# Patient Record
Sex: Male | Born: 1965
Health system: Southern US, Community
[De-identification: ages and names within clinical notes are randomized; demographics above are authoritative.]

## PROBLEM LIST (undated history)

## (undated) DIAGNOSIS — F988 Other specified behavioral and emotional disorders with onset usually occurring in childhood and adolescence: Secondary | ICD-10-CM

## (undated) DIAGNOSIS — E785 Hyperlipidemia, unspecified: Secondary | ICD-10-CM

## (undated) DIAGNOSIS — F319 Bipolar disorder, unspecified: Secondary | ICD-10-CM

## (undated) HISTORY — DX: Other specified behavioral and emotional disorders with onset usually occurring in childhood and adolescence: F98.8

## (undated) HISTORY — DX: Bipolar disorder, unspecified: F31.9

## (undated) HISTORY — PX: FEMUR FRACTURE SURGERY: SHX633

## (undated) HISTORY — PX: OTHER SURGICAL HISTORY: SHX169

## (undated) HISTORY — DX: Hyperlipidemia, unspecified: E78.5

## (undated) HISTORY — PX: PATELLA FRACTURE SURGERY: SHX735

---

## 2000-12-31 ENCOUNTER — Emergency Department (HOSPITAL_COMMUNITY): Admission: EM | Admit: 2000-12-31 | Discharge: 2000-12-31 | Payer: Self-pay | Admitting: Emergency Medicine

## 2000-12-31 ENCOUNTER — Encounter: Payer: Self-pay | Admitting: Emergency Medicine

## 2003-01-17 ENCOUNTER — Encounter: Payer: Self-pay | Admitting: Emergency Medicine

## 2003-01-17 ENCOUNTER — Emergency Department (HOSPITAL_COMMUNITY): Admission: EM | Admit: 2003-01-17 | Discharge: 2003-01-17 | Payer: Self-pay | Admitting: Emergency Medicine

## 2003-03-02 ENCOUNTER — Emergency Department (HOSPITAL_COMMUNITY): Admission: EM | Admit: 2003-03-02 | Discharge: 2003-03-03 | Payer: Self-pay | Admitting: Emergency Medicine

## 2005-12-01 ENCOUNTER — Encounter: Admission: RE | Admit: 2005-12-01 | Discharge: 2005-12-01 | Payer: Self-pay | Admitting: Family Medicine

## 2008-06-13 ENCOUNTER — Emergency Department (HOSPITAL_BASED_OUTPATIENT_CLINIC_OR_DEPARTMENT_OTHER): Admission: EM | Admit: 2008-06-13 | Discharge: 2008-06-13 | Payer: Self-pay | Admitting: Emergency Medicine

## 2008-09-30 ENCOUNTER — Emergency Department (HOSPITAL_BASED_OUTPATIENT_CLINIC_OR_DEPARTMENT_OTHER): Admission: EM | Admit: 2008-09-30 | Discharge: 2008-09-30 | Payer: Self-pay | Admitting: Emergency Medicine

## 2011-01-29 ENCOUNTER — Emergency Department (HOSPITAL_BASED_OUTPATIENT_CLINIC_OR_DEPARTMENT_OTHER)
Admission: EM | Admit: 2011-01-29 | Discharge: 2011-01-29 | Disposition: A | Discharge status: Home / Self Care | Payer: 59 | Attending: Emergency Medicine | Admitting: Emergency Medicine

## 2011-01-29 DIAGNOSIS — R0789 Other chest pain: Secondary | ICD-10-CM | POA: Insufficient documentation

## 2011-01-29 DIAGNOSIS — R0989 Other specified symptoms and signs involving the circulatory and respiratory systems: Secondary | ICD-10-CM | POA: Insufficient documentation

## 2011-01-29 DIAGNOSIS — E785 Hyperlipidemia, unspecified: Secondary | ICD-10-CM | POA: Insufficient documentation

## 2011-01-29 DIAGNOSIS — R0609 Other forms of dyspnea: Secondary | ICD-10-CM | POA: Insufficient documentation

## 2011-01-29 DIAGNOSIS — F988 Other specified behavioral and emotional disorders with onset usually occurring in childhood and adolescence: Secondary | ICD-10-CM | POA: Insufficient documentation

## 2011-01-29 DIAGNOSIS — R7309 Other abnormal glucose: Secondary | ICD-10-CM | POA: Insufficient documentation

## 2011-01-29 LAB — DIFFERENTIAL
Basophils Absolute: 0 10*3/uL (ref 0.0–0.1)
Basophils Relative: 1 % (ref 0–1)
Eosinophils Absolute: 0.1 10*3/uL (ref 0.0–0.7)
Eosinophils Relative: 2 % (ref 0–5)
Lymphocytes Relative: 28 % (ref 12–46)
Lymphs Abs: 1.5 10*3/uL (ref 0.7–4.0)
Monocytes Absolute: 0.4 10*3/uL (ref 0.1–1.0)
Monocytes Relative: 8 % (ref 3–12)
Neutro Abs: 3.3 10*3/uL (ref 1.7–7.7)
Neutrophils Relative %: 61 % (ref 43–77)

## 2011-01-29 LAB — BASIC METABOLIC PANEL
Calcium: 9.8 mg/dL (ref 8.4–10.5)
GFR calc Af Amer: >60 mL/min (ref >60)
GFR calc non Af Amer: >60 mL/min (ref >60)
Glucose, Bld: 169 mg/dL — ABNORMAL HIGH (ref 70–99)
Potassium: 3.7 mEq/L (ref 3.5–5.1)
Sodium: 143 mEq/L (ref 135–145)

## 2011-01-29 LAB — POCT CARDIAC MARKERS
CKMB, poc: 2.3 ng/mL (ref 1.0–8.0)
Myoglobin, poc: 116 ng/mL (ref 12–200)
Troponin i, poc: <0.05 ng/mL (ref 0.00–0.09)

## 2011-01-29 LAB — CBC
HCT: 42 % (ref 39.0–52.0)
Hemoglobin: 15 g/dL (ref 13.0–17.0)
MCH: 30.2 pg (ref 26.0–34.0)
MCHC: 35.7 g/dL (ref 30.0–36.0)
MCV: 84.7 fL (ref 78.0–100.0)
Platelets: 166 10*3/uL (ref 150–400)
RBC: 4.96 MIL/uL (ref 4.22–5.81)
RDW: 12.5 % (ref 11.5–15.5)
WBC: 5.4 10*3/uL (ref 4.0–10.5)

## 2015-12-31 LAB — HM COLONOSCOPY

## 2017-02-23 DIAGNOSIS — Z125 Encounter for screening for malignant neoplasm of prostate: Secondary | ICD-10-CM | POA: Diagnosis not present

## 2017-02-23 DIAGNOSIS — Z0000 Encounter for general adult medical examination without abnormal findings: Secondary | ICD-10-CM | POA: Diagnosis not present

## 2017-02-23 DIAGNOSIS — E7800 Pure hypercholesterolemia, unspecified: Secondary | ICD-10-CM | POA: Diagnosis not present

## 2017-03-02 DIAGNOSIS — J8410 Pulmonary fibrosis, unspecified: Secondary | ICD-10-CM | POA: Diagnosis not present

## 2017-03-02 DIAGNOSIS — R911 Solitary pulmonary nodule: Secondary | ICD-10-CM | POA: Diagnosis not present

## 2017-03-02 DIAGNOSIS — K7689 Other specified diseases of liver: Secondary | ICD-10-CM | POA: Diagnosis not present

## 2017-03-30 DIAGNOSIS — R972 Elevated prostate specific antigen [PSA]: Secondary | ICD-10-CM | POA: Diagnosis not present

## 2017-05-11 DIAGNOSIS — R972 Elevated prostate specific antigen [PSA]: Secondary | ICD-10-CM | POA: Diagnosis not present

## 2018-03-01 ENCOUNTER — Ambulatory Visit
Admission: RE | Admit: 2018-03-01 | Discharge: 2018-03-01 | Disposition: A | Discharge status: Home / Self Care | Payer: BLUE CROSS/BLUE SHIELD | Source: Ambulatory Visit | Attending: Family Medicine | Admitting: Family Medicine

## 2018-03-01 ENCOUNTER — Other Ambulatory Visit: Payer: Self-pay | Admitting: Family Medicine

## 2018-03-01 DIAGNOSIS — M545 Low back pain: Secondary | ICD-10-CM | POA: Diagnosis not present

## 2018-03-01 DIAGNOSIS — F900 Attention-deficit hyperactivity disorder, predominantly inattentive type: Secondary | ICD-10-CM | POA: Diagnosis not present

## 2018-03-29 DIAGNOSIS — R972 Elevated prostate specific antigen [PSA]: Secondary | ICD-10-CM | POA: Diagnosis not present

## 2018-03-29 DIAGNOSIS — F900 Attention-deficit hyperactivity disorder, predominantly inattentive type: Secondary | ICD-10-CM | POA: Diagnosis not present

## 2018-03-29 DIAGNOSIS — F320 Major depressive disorder, single episode, mild: Secondary | ICD-10-CM | POA: Diagnosis not present

## 2018-03-29 DIAGNOSIS — Z0000 Encounter for general adult medical examination without abnormal findings: Secondary | ICD-10-CM | POA: Diagnosis not present

## 2018-03-29 DIAGNOSIS — M545 Low back pain: Secondary | ICD-10-CM | POA: Diagnosis not present

## 2018-03-29 DIAGNOSIS — E7800 Pure hypercholesterolemia, unspecified: Secondary | ICD-10-CM | POA: Diagnosis not present

## 2018-05-01 DIAGNOSIS — F900 Attention-deficit hyperactivity disorder, predominantly inattentive type: Secondary | ICD-10-CM | POA: Diagnosis not present

## 2018-05-01 DIAGNOSIS — M545 Low back pain: Secondary | ICD-10-CM | POA: Diagnosis not present

## 2018-05-01 DIAGNOSIS — F320 Major depressive disorder, single episode, mild: Secondary | ICD-10-CM | POA: Diagnosis not present

## 2018-06-10 DIAGNOSIS — E7800 Pure hypercholesterolemia, unspecified: Secondary | ICD-10-CM | POA: Diagnosis not present

## 2018-10-01 DIAGNOSIS — F900 Attention-deficit hyperactivity disorder, predominantly inattentive type: Secondary | ICD-10-CM | POA: Diagnosis not present

## 2018-10-01 DIAGNOSIS — Z23 Encounter for immunization: Secondary | ICD-10-CM | POA: Diagnosis not present

## 2018-10-01 DIAGNOSIS — F320 Major depressive disorder, single episode, mild: Secondary | ICD-10-CM | POA: Diagnosis not present

## 2018-10-24 DIAGNOSIS — Z79891 Long term (current) use of opiate analgesic: Secondary | ICD-10-CM | POA: Diagnosis not present

## 2018-10-24 DIAGNOSIS — F319 Bipolar disorder, unspecified: Secondary | ICD-10-CM | POA: Diagnosis not present

## 2018-10-24 DIAGNOSIS — F410 Panic disorder [episodic paroxysmal anxiety] without agoraphobia: Secondary | ICD-10-CM | POA: Diagnosis not present

## 2018-10-24 DIAGNOSIS — F4481 Dissociative identity disorder: Secondary | ICD-10-CM | POA: Diagnosis not present

## 2018-11-21 DIAGNOSIS — F4481 Dissociative identity disorder: Secondary | ICD-10-CM | POA: Diagnosis not present

## 2018-11-21 DIAGNOSIS — F319 Bipolar disorder, unspecified: Secondary | ICD-10-CM | POA: Diagnosis not present

## 2018-11-21 DIAGNOSIS — F410 Panic disorder [episodic paroxysmal anxiety] without agoraphobia: Secondary | ICD-10-CM | POA: Diagnosis not present

## 2018-12-12 DIAGNOSIS — F319 Bipolar disorder, unspecified: Secondary | ICD-10-CM | POA: Diagnosis not present

## 2018-12-12 DIAGNOSIS — F410 Panic disorder [episodic paroxysmal anxiety] without agoraphobia: Secondary | ICD-10-CM | POA: Diagnosis not present

## 2018-12-23 DIAGNOSIS — F4481 Dissociative identity disorder: Secondary | ICD-10-CM | POA: Diagnosis not present

## 2018-12-23 DIAGNOSIS — F319 Bipolar disorder, unspecified: Secondary | ICD-10-CM | POA: Diagnosis not present

## 2018-12-23 DIAGNOSIS — F410 Panic disorder [episodic paroxysmal anxiety] without agoraphobia: Secondary | ICD-10-CM | POA: Diagnosis not present

## 2019-01-07 DIAGNOSIS — F319 Bipolar disorder, unspecified: Secondary | ICD-10-CM | POA: Diagnosis not present

## 2019-01-07 DIAGNOSIS — F4481 Dissociative identity disorder: Secondary | ICD-10-CM | POA: Diagnosis not present

## 2019-01-07 DIAGNOSIS — F410 Panic disorder [episodic paroxysmal anxiety] without agoraphobia: Secondary | ICD-10-CM | POA: Diagnosis not present

## 2019-01-22 DIAGNOSIS — F319 Bipolar disorder, unspecified: Secondary | ICD-10-CM | POA: Diagnosis not present

## 2019-01-22 DIAGNOSIS — F4481 Dissociative identity disorder: Secondary | ICD-10-CM | POA: Diagnosis not present

## 2019-01-22 DIAGNOSIS — F410 Panic disorder [episodic paroxysmal anxiety] without agoraphobia: Secondary | ICD-10-CM | POA: Diagnosis not present

## 2019-02-20 DIAGNOSIS — F319 Bipolar disorder, unspecified: Secondary | ICD-10-CM | POA: Diagnosis not present

## 2019-02-20 DIAGNOSIS — F410 Panic disorder [episodic paroxysmal anxiety] without agoraphobia: Secondary | ICD-10-CM | POA: Diagnosis not present

## 2019-04-02 DIAGNOSIS — Z125 Encounter for screening for malignant neoplasm of prostate: Secondary | ICD-10-CM | POA: Diagnosis not present

## 2019-04-02 DIAGNOSIS — E7800 Pure hypercholesterolemia, unspecified: Secondary | ICD-10-CM | POA: Diagnosis not present

## 2019-04-02 DIAGNOSIS — F900 Attention-deficit hyperactivity disorder, predominantly inattentive type: Secondary | ICD-10-CM | POA: Diagnosis not present

## 2019-04-02 DIAGNOSIS — F319 Bipolar disorder, unspecified: Secondary | ICD-10-CM | POA: Diagnosis not present

## 2019-04-07 DIAGNOSIS — E7800 Pure hypercholesterolemia, unspecified: Secondary | ICD-10-CM | POA: Diagnosis not present

## 2019-04-07 DIAGNOSIS — Z125 Encounter for screening for malignant neoplasm of prostate: Secondary | ICD-10-CM | POA: Diagnosis not present

## 2019-04-14 DIAGNOSIS — F410 Panic disorder [episodic paroxysmal anxiety] without agoraphobia: Secondary | ICD-10-CM | POA: Diagnosis not present

## 2019-04-21 DIAGNOSIS — F410 Panic disorder [episodic paroxysmal anxiety] without agoraphobia: Secondary | ICD-10-CM | POA: Diagnosis not present

## 2019-04-21 DIAGNOSIS — F4481 Dissociative identity disorder: Secondary | ICD-10-CM | POA: Diagnosis not present

## 2019-04-21 DIAGNOSIS — F319 Bipolar disorder, unspecified: Secondary | ICD-10-CM | POA: Diagnosis not present

## 2019-05-20 DIAGNOSIS — F410 Panic disorder [episodic paroxysmal anxiety] without agoraphobia: Secondary | ICD-10-CM | POA: Diagnosis not present

## 2019-05-20 DIAGNOSIS — F319 Bipolar disorder, unspecified: Secondary | ICD-10-CM | POA: Diagnosis not present

## 2019-05-20 DIAGNOSIS — F4481 Dissociative identity disorder: Secondary | ICD-10-CM | POA: Diagnosis not present

## 2019-10-06 DIAGNOSIS — F410 Panic disorder [episodic paroxysmal anxiety] without agoraphobia: Secondary | ICD-10-CM | POA: Diagnosis not present

## 2019-10-06 DIAGNOSIS — F4481 Dissociative identity disorder: Secondary | ICD-10-CM | POA: Diagnosis not present

## 2019-10-06 DIAGNOSIS — F319 Bipolar disorder, unspecified: Secondary | ICD-10-CM | POA: Diagnosis not present

## 2019-10-08 DIAGNOSIS — F4481 Dissociative identity disorder: Secondary | ICD-10-CM | POA: Diagnosis not present

## 2019-10-08 DIAGNOSIS — F410 Panic disorder [episodic paroxysmal anxiety] without agoraphobia: Secondary | ICD-10-CM | POA: Diagnosis not present

## 2019-10-08 DIAGNOSIS — F319 Bipolar disorder, unspecified: Secondary | ICD-10-CM | POA: Diagnosis not present

## 2019-10-13 DIAGNOSIS — F410 Panic disorder [episodic paroxysmal anxiety] without agoraphobia: Secondary | ICD-10-CM | POA: Diagnosis not present

## 2019-10-13 DIAGNOSIS — F319 Bipolar disorder, unspecified: Secondary | ICD-10-CM | POA: Diagnosis not present

## 2019-10-13 DIAGNOSIS — F4481 Dissociative identity disorder: Secondary | ICD-10-CM | POA: Diagnosis not present

## 2019-10-29 DIAGNOSIS — F410 Panic disorder [episodic paroxysmal anxiety] without agoraphobia: Secondary | ICD-10-CM | POA: Diagnosis not present

## 2019-10-29 DIAGNOSIS — F4481 Dissociative identity disorder: Secondary | ICD-10-CM | POA: Diagnosis not present

## 2019-10-29 DIAGNOSIS — F319 Bipolar disorder, unspecified: Secondary | ICD-10-CM | POA: Diagnosis not present

## 2019-11-04 DIAGNOSIS — F4481 Dissociative identity disorder: Secondary | ICD-10-CM | POA: Diagnosis not present

## 2019-11-04 DIAGNOSIS — F410 Panic disorder [episodic paroxysmal anxiety] without agoraphobia: Secondary | ICD-10-CM | POA: Diagnosis not present

## 2019-11-04 DIAGNOSIS — F319 Bipolar disorder, unspecified: Secondary | ICD-10-CM | POA: Diagnosis not present

## 2019-11-12 DIAGNOSIS — F410 Panic disorder [episodic paroxysmal anxiety] without agoraphobia: Secondary | ICD-10-CM | POA: Diagnosis not present

## 2019-11-12 DIAGNOSIS — F319 Bipolar disorder, unspecified: Secondary | ICD-10-CM | POA: Diagnosis not present

## 2019-11-12 DIAGNOSIS — F4481 Dissociative identity disorder: Secondary | ICD-10-CM | POA: Diagnosis not present

## 2019-11-25 DIAGNOSIS — F410 Panic disorder [episodic paroxysmal anxiety] without agoraphobia: Secondary | ICD-10-CM | POA: Diagnosis not present

## 2019-11-25 DIAGNOSIS — F319 Bipolar disorder, unspecified: Secondary | ICD-10-CM | POA: Diagnosis not present

## 2019-11-25 DIAGNOSIS — F4481 Dissociative identity disorder: Secondary | ICD-10-CM | POA: Diagnosis not present

## 2019-12-10 DIAGNOSIS — F4481 Dissociative identity disorder: Secondary | ICD-10-CM | POA: Diagnosis not present

## 2019-12-10 DIAGNOSIS — F410 Panic disorder [episodic paroxysmal anxiety] without agoraphobia: Secondary | ICD-10-CM | POA: Diagnosis not present

## 2019-12-10 DIAGNOSIS — F319 Bipolar disorder, unspecified: Secondary | ICD-10-CM | POA: Diagnosis not present

## 2019-12-24 DIAGNOSIS — F410 Panic disorder [episodic paroxysmal anxiety] without agoraphobia: Secondary | ICD-10-CM | POA: Diagnosis not present

## 2019-12-24 DIAGNOSIS — F319 Bipolar disorder, unspecified: Secondary | ICD-10-CM | POA: Diagnosis not present

## 2019-12-24 DIAGNOSIS — F4481 Dissociative identity disorder: Secondary | ICD-10-CM | POA: Diagnosis not present

## 2020-01-07 DIAGNOSIS — F319 Bipolar disorder, unspecified: Secondary | ICD-10-CM | POA: Diagnosis not present

## 2020-01-07 DIAGNOSIS — F410 Panic disorder [episodic paroxysmal anxiety] without agoraphobia: Secondary | ICD-10-CM | POA: Diagnosis not present

## 2020-01-07 DIAGNOSIS — F4481 Dissociative identity disorder: Secondary | ICD-10-CM | POA: Diagnosis not present

## 2020-02-02 DIAGNOSIS — F4481 Dissociative identity disorder: Secondary | ICD-10-CM | POA: Diagnosis not present

## 2020-02-02 DIAGNOSIS — F319 Bipolar disorder, unspecified: Secondary | ICD-10-CM | POA: Diagnosis not present

## 2020-02-02 DIAGNOSIS — F410 Panic disorder [episodic paroxysmal anxiety] without agoraphobia: Secondary | ICD-10-CM | POA: Diagnosis not present

## 2020-02-11 DIAGNOSIS — F410 Panic disorder [episodic paroxysmal anxiety] without agoraphobia: Secondary | ICD-10-CM | POA: Diagnosis not present

## 2020-02-11 DIAGNOSIS — F4481 Dissociative identity disorder: Secondary | ICD-10-CM | POA: Diagnosis not present

## 2020-02-11 DIAGNOSIS — F319 Bipolar disorder, unspecified: Secondary | ICD-10-CM | POA: Diagnosis not present

## 2020-04-05 DIAGNOSIS — Z0000 Encounter for general adult medical examination without abnormal findings: Secondary | ICD-10-CM | POA: Diagnosis not present

## 2020-04-05 DIAGNOSIS — E7800 Pure hypercholesterolemia, unspecified: Secondary | ICD-10-CM | POA: Diagnosis not present

## 2020-04-05 DIAGNOSIS — R972 Elevated prostate specific antigen [PSA]: Secondary | ICD-10-CM | POA: Diagnosis not present

## 2020-08-12 DIAGNOSIS — F410 Panic disorder [episodic paroxysmal anxiety] without agoraphobia: Secondary | ICD-10-CM | POA: Diagnosis not present

## 2020-08-12 DIAGNOSIS — F319 Bipolar disorder, unspecified: Secondary | ICD-10-CM | POA: Diagnosis not present

## 2020-08-12 DIAGNOSIS — F4481 Dissociative identity disorder: Secondary | ICD-10-CM | POA: Diagnosis not present

## 2020-09-14 DIAGNOSIS — R251 Tremor, unspecified: Secondary | ICD-10-CM | POA: Diagnosis not present

## 2020-09-14 DIAGNOSIS — Z23 Encounter for immunization: Secondary | ICD-10-CM | POA: Diagnosis not present

## 2020-10-21 ENCOUNTER — Encounter: Payer: Self-pay | Admitting: Neurology

## 2020-10-21 DIAGNOSIS — F4481 Dissociative identity disorder: Secondary | ICD-10-CM | POA: Diagnosis not present

## 2020-10-21 DIAGNOSIS — F410 Panic disorder [episodic paroxysmal anxiety] without agoraphobia: Secondary | ICD-10-CM | POA: Diagnosis not present

## 2020-10-21 DIAGNOSIS — F319 Bipolar disorder, unspecified: Secondary | ICD-10-CM | POA: Diagnosis not present

## 2020-10-21 NOTE — Progress Notes (Signed)
Assessment/Plan:   Secondary parkinsonism   -I had a long counseling session with the patient today.  I discussed with the patient that he likely has secondary parkinsonism due to abilify.  I explained that one clinically cannot tell the difference between idiopathic parkinsons disease and secondary parkinsonism from medication.  I also explained that even if one is able to get off of the medication, it can take up to 6 months to clinically definitively know if this is idiopathic parkinsons disease.  He previously went off of it, but only for about a week and a half.  I did not advise that the patient go off of medication, as this needs to be discussed with the patients prescribing physician.  I did, however, tell the patient that the longer one is on the medication, the worse the symptoms can get.  The patient is to make an appointment with his psychiatrist to discuss what I have discussed with him.    Subjective:   Terrance Webb was seen today in the movement disorders clinic for neurologic consultation at the request of Clovis Riley, L.August Saucer, MD.  The consultation is for the evaluation of resting tremor of the right hand, present for the last few months.  Primary care records are reviewed.    Specific Symptoms:  Tremor: Yes.   , R hand rest tremor, rarely in the L.  No change in the last few months Family hx of similar:  No. Voice: no change Sleep: sleeps well  Vivid Dreams:  Yes.    Acting out dreams:  No. Wet Pillows: Yes.   Postural symptoms:  No.  Falls?  No. Bradykinesia symptoms: shuffling gait - "I've been a shuffler." Loss of smell:  No. Loss of taste:  No. Urinary Incontinence:  No. Difficulty Swallowing:  Yes.  , chronic issues with dry foods Handwriting, micrographia: No. Trouble with ADL's:  No.  Trouble buttoning clothing: No. Memory changes:  No. Hallucinations:  No.  visual distortions: No. N/V:  No. Lightheaded:  No.  Syncope: No. Diplopia:   No. Dyskinesia:  No. Prior exposure to reglan/antipsychotics: Yes.  , abilify.  Did speak with psychiatrist about whether or not the medication could be related to Abilify, and he believes that psychiatry just offered him to take a medication to help him with tremor.  States that went off of med for 1.5 weeks and didn't note change and anxiety worse  Neuroimaging of the brain has previously been performed but not in the recent years    ALLERGIES:   Allergies  Allergen Reactions  . Vyvanse [Lisdexamfetamine] Other (See Comments)    Panic attacks    CURRENT MEDICATIONS:  Current Outpatient Medications  Medication Instructions  . ARIPiprazole (ABILIFY) 2 mg, Oral, Daily  . atorvastatin (LIPITOR) 40 mg, Oral, Daily  . etodolac (LODINE) 400 mg, Oral, 2 times daily  . lamoTRIgine (LAMICTAL) 200 mg, Oral, Daily  . Melatonin 2.5 MG CAPS Oral, At bedtime PRN  . sertraline (ZOLOFT) 50 mg, Oral, Daily    Objective:   VITALS:   Vitals:   10/25/20 0911  BP: 134/86  Pulse: 66  SpO2: 98%  Weight: 192 lb (87.1 kg)  Height: 5\' 8"  (1.727 m)    GEN:  The patient appears stated age and is in NAD. HEENT:  Normocephalic, atraumatic.  The mucous membranes are moist. The superficial temporal arteries are without ropiness or tenderness. CV:  RRR Lungs:  CTAB Neck/HEME:  There are no carotid bruits bilaterally.  Neurological  examination:  Orientation: The patient is alert and oriented x3.  Cranial nerves: There is good facial symmetry. Extraocular muscles are intact. The visual fields are full to confrontational testing. The speech is fluent and clear. Soft palate rises symmetrically and there is no tongue deviation. Hearing is intact to conversational tone. Sensation: Sensation is intact to light and pinprick throughout (facial, trunk, extremities). Vibration is intact at the bilateral big toe. There is no extinction with double simultaneous stimulation. There is no sensory dermatomal level  identified. Motor: Strength is 5/5 in the bilateral upper and lower extremities.   Shoulder shrug is equal and symmetric.  There is no pronator drift. Deep tendon reflexes: Deep tendon reflexes are 2/4 at the bilateral biceps, triceps, brachioradialis, patella and achilles. Plantar responses are downgoing bilaterally.  Movement examination: Tone: There is trace increased tone in the RUE.  Tone elsewhere is nl Abnormal movements: nothere is R>LUE rest tremor Coordination:  There is no decremation with RAM's, with any form of RAMS, including alternating supination and pronation of the forearm, hand opening and closing, finger taps, heel taps and toe taps bilaterally Gait and Station: The patient has no difficulty arising out of a deep-seated chair without the use of the hands. The patient's stride length is good.   I have reviewed and interpreted the following labs independently Labs done at primary care on Apr 05, 2020.  Sodium was 141, potassium 4.2, chloride 106, CO2 29, BUN 22, creatinine 1.14, glucose 104, BUN 22, creatinine 1.14, AST 25, ALT 47.    Cc:  Clovis Riley, L.August Saucer, MD

## 2020-10-25 ENCOUNTER — Other Ambulatory Visit: Payer: Self-pay

## 2020-10-25 ENCOUNTER — Encounter: Payer: Self-pay | Admitting: Neurology

## 2020-10-25 ENCOUNTER — Ambulatory Visit (INDEPENDENT_AMBULATORY_CARE_PROVIDER_SITE_OTHER): Payer: BC Managed Care – PPO | Admitting: Neurology

## 2020-10-25 VITALS — BP 134/86 | HR 66 | Ht 68.0 in | Wt 192.0 lb

## 2020-10-25 DIAGNOSIS — G2111 Neuroleptic induced parkinsonism: Secondary | ICD-10-CM

## 2020-10-25 NOTE — Patient Instructions (Addendum)
It was my pleasure to meet you!  Follow up with your psychiatrist to see if there is another medication to replace your abilify.  Do NOT change your medication without your physicians approval.  If you are able to get off of that medication, your symptoms should improve in the next 6 months.  If they don't, make a follow up with me.    The physicians and staff at [facility] are committed to providing excellent care. You may receive a survey requesting feedback about your experience at our office. We strive to receive "very good" responses to the survey questions. If you feel that your experience would prevent you from giving the office a "very good " response, please contact our office to try to remedy the situation. We may be reached at [phone]. Thank you for taking the time out of your busy day to complete the survey.

## 2021-01-27 DIAGNOSIS — F410 Panic disorder [episodic paroxysmal anxiety] without agoraphobia: Secondary | ICD-10-CM | POA: Diagnosis not present

## 2021-01-27 DIAGNOSIS — F4481 Dissociative identity disorder: Secondary | ICD-10-CM | POA: Diagnosis not present

## 2021-01-27 DIAGNOSIS — F319 Bipolar disorder, unspecified: Secondary | ICD-10-CM | POA: Diagnosis not present

## 2021-02-24 DIAGNOSIS — F4481 Dissociative identity disorder: Secondary | ICD-10-CM | POA: Diagnosis not present

## 2021-02-24 DIAGNOSIS — F319 Bipolar disorder, unspecified: Secondary | ICD-10-CM | POA: Diagnosis not present

## 2021-02-24 DIAGNOSIS — F410 Panic disorder [episodic paroxysmal anxiety] without agoraphobia: Secondary | ICD-10-CM | POA: Diagnosis not present

## 2021-03-29 DIAGNOSIS — F410 Panic disorder [episodic paroxysmal anxiety] without agoraphobia: Secondary | ICD-10-CM | POA: Diagnosis not present

## 2021-03-29 DIAGNOSIS — F319 Bipolar disorder, unspecified: Secondary | ICD-10-CM | POA: Diagnosis not present

## 2021-03-29 DIAGNOSIS — F4481 Dissociative identity disorder: Secondary | ICD-10-CM | POA: Diagnosis not present

## 2021-04-07 DIAGNOSIS — R972 Elevated prostate specific antigen [PSA]: Secondary | ICD-10-CM | POA: Diagnosis not present

## 2021-04-07 DIAGNOSIS — E7800 Pure hypercholesterolemia, unspecified: Secondary | ICD-10-CM | POA: Diagnosis not present

## 2021-04-07 DIAGNOSIS — Z23 Encounter for immunization: Secondary | ICD-10-CM | POA: Diagnosis not present

## 2021-04-07 DIAGNOSIS — Z0000 Encounter for general adult medical examination without abnormal findings: Secondary | ICD-10-CM | POA: Diagnosis not present

## 2021-06-08 DIAGNOSIS — Z23 Encounter for immunization: Secondary | ICD-10-CM | POA: Diagnosis not present

## 2021-07-04 DIAGNOSIS — F4481 Dissociative identity disorder: Secondary | ICD-10-CM | POA: Diagnosis not present

## 2021-07-04 DIAGNOSIS — F410 Panic disorder [episodic paroxysmal anxiety] without agoraphobia: Secondary | ICD-10-CM | POA: Diagnosis not present

## 2021-07-04 DIAGNOSIS — F319 Bipolar disorder, unspecified: Secondary | ICD-10-CM | POA: Diagnosis not present

## 2021-12-29 DIAGNOSIS — F319 Bipolar disorder, unspecified: Secondary | ICD-10-CM | POA: Diagnosis not present

## 2021-12-29 DIAGNOSIS — F410 Panic disorder [episodic paroxysmal anxiety] without agoraphobia: Secondary | ICD-10-CM | POA: Diagnosis not present

## 2021-12-29 DIAGNOSIS — F4481 Dissociative identity disorder: Secondary | ICD-10-CM | POA: Diagnosis not present

## 2022-01-24 ENCOUNTER — Encounter: Payer: Self-pay | Admitting: Family Medicine

## 2022-01-24 ENCOUNTER — Other Ambulatory Visit: Payer: Self-pay

## 2022-01-24 ENCOUNTER — Ambulatory Visit (INDEPENDENT_AMBULATORY_CARE_PROVIDER_SITE_OTHER): Payer: BC Managed Care – PPO | Admitting: Family Medicine

## 2022-01-24 VITALS — BP 117/70 | HR 59 | Resp 16 | Ht 68.0 in | Wt 192.0 lb

## 2022-01-24 DIAGNOSIS — E7800 Pure hypercholesterolemia, unspecified: Secondary | ICD-10-CM | POA: Diagnosis not present

## 2022-01-24 DIAGNOSIS — G8929 Other chronic pain: Secondary | ICD-10-CM

## 2022-01-24 DIAGNOSIS — K219 Gastro-esophageal reflux disease without esophagitis: Secondary | ICD-10-CM | POA: Insufficient documentation

## 2022-01-24 DIAGNOSIS — F988 Other specified behavioral and emotional disorders with onset usually occurring in childhood and adolescence: Secondary | ICD-10-CM | POA: Insufficient documentation

## 2022-01-24 DIAGNOSIS — M545 Low back pain, unspecified: Secondary | ICD-10-CM | POA: Diagnosis not present

## 2022-01-24 DIAGNOSIS — F319 Bipolar disorder, unspecified: Secondary | ICD-10-CM | POA: Diagnosis not present

## 2022-01-24 DIAGNOSIS — F1021 Alcohol dependence, in remission: Secondary | ICD-10-CM

## 2022-01-24 DIAGNOSIS — G2111 Neuroleptic induced parkinsonism: Secondary | ICD-10-CM

## 2022-01-24 DIAGNOSIS — R911 Solitary pulmonary nodule: Secondary | ICD-10-CM | POA: Insufficient documentation

## 2022-01-24 DIAGNOSIS — K6289 Other specified diseases of anus and rectum: Secondary | ICD-10-CM

## 2022-01-24 NOTE — Assessment & Plan Note (Addendum)
On Etoladac daily.

## 2022-01-24 NOTE — Progress Notes (Signed)
Established Patient Office Visit  Subjective:  Patient ID: Terrance Webb, male    DOB: 11-Nov-1966  Age: 56 y.o. MRN: [id number]  CC:  Chief Complaint  Patient presents with   Establish Care   Rectal Concern    Growth near rectum for several years. Patient denies pain or bleeding.    Colonoscopy     Done at 56 years old with [facility] in [place]. ( Release form faxed )    HPI Terrance Webb presents to establish care.  Overall he is doing well he has a history of bipolar disorder and is currently being treated with Zoloft, Abilify, and Lamictal.  He is been pretty stable on his current regimen for about the last 2 years he has been happy with it.  Unfortunately one of the side effects of the Abilify is that it has caused a a tremor.  He did consult with neurology and they felt like it was consistent with pseudo-Parkinson's secondary to the Abilify.  So they did add propranolol twice a day to help control the tremor he does feel like it is helpful but it does not stop it completely.  He does follow with a nurse practitioner for his psychiatric medications.  He also has history of chronic low back pain and takes etodolac twice a day he says if he comes off of it the back pain tends to come back.  History of hyperlipidemia-he has been on Lipitor for years and has tolerated it well without any problems or side effects.  He is concerned about a lump in the rectum he notices it sometimes when he is in the shower cleaning.  It is not painful or bothersome he does not remember if it was there when he had his colonoscopy at 49.  Is not had any bleeding.  He has had a history of hemorrhoids and has had to have those lanced before.  He was previously followed at [facility].  Past Medical History:  Diagnosis Date   ADD (attention deficit disorder)    Bipolar disorder (HCC)    Hyperlipidemia    hypercholesterolemia    Past Surgical History:  Procedure Laterality Date   FEMUR  FRACTURE SURGERY Left    left thigh lipoma     PATELLA FRACTURE SURGERY Left     Family History  Problem Relation Age of Onset   CAD Mother    Other Mother        respiratory sclerosis   Mental illness Mother        Borderline or Cinalli disorder./Opioid dependence   Alzheimer's disease Father    Heart attack Father    Prostate cancer Father    Melanoma Father     Social History   Socioeconomic History   Marital status: Married    Spouse name: Not on file   Number of children: 0   Years of education: Not on file   Highest education level: Not on file  Occupational History   Occupation: Retired  Tobacco Use   Smoking status: Never   Smokeless tobacco: Never  Vaping Use   Vaping Use: Never used  Substance and Sexual Activity   Alcohol use: Not Currently    Comment: hx of heavier EtOH use - quit 2 years ago   Drug use: Never   Sexual activity: Not Currently  Other Topics Concern   Not on file  Social History Narrative   No exercise. 3 cups of coffee a day  Social Determinants of Health   Financial Resource Strain: Not on file  Food Insecurity: Not on file  Transportation Needs: Not on file  Physical Activity: Not on file  Stress: Not on file  Social Connections: Not on file  Intimate Partner Violence: Not on file    Outpatient Medications Prior to Visit  Medication Sig Dispense Refill   ARIPiprazole (ABILIFY) 2 MG tablet Take 2 mg by mouth daily.     atorvastatin (LIPITOR) 40 MG tablet Take 40 mg by mouth daily.     etodolac (LODINE) 400 MG tablet Take 400 mg by mouth 2 (two) times daily.     lamoTRIgine (LAMICTAL) 200 MG tablet Take 200 mg by mouth daily.     Melatonin 2.5 MG CAPS Take by mouth at bedtime as needed.     sertraline (ZOLOFT) 50 MG tablet Take 50 mg by mouth daily.     No facility-administered medications prior to visit.    Allergies  Allergen Reactions   Vyvanse [Lisdexamfetamine] Other (See Comments)    Panic attacks     ROS Review of Systems    Objective:    Physical Exam Constitutional:      Appearance: Normal appearance. He is well-developed.  HENT:     Head: Normocephalic and atraumatic.  Cardiovascular:     Rate and Rhythm: Normal rate and regular rhythm.     Heart sounds: Normal heart sounds.  Pulmonary:     Effort: Pulmonary effort is normal.     Breath sounds: Normal breath sounds.  Skin:    General: Skin is warm and dry.     Comments: Around the rectum close to the 1 o'clock position there is a little bit of white scar tissue likely from a prior hemorrhoidectomy.  No bulge or mass or abnormal appearing tissue.  Neurological:     Mental Status: He is alert and oriented to person, place, and time. Mental status is at baseline.  Psychiatric:        Behavior: Behavior normal.    BP 117/70    Pulse (!) 59    Resp 16    Ht 5\' 8"  (1.727 m)    Wt 192 lb (87.1 kg)    SpO2 95%    BMI 29.19 kg/m  Wt Readings from Last 3 Encounters:  01/24/22 192 lb (87.1 kg)  10/25/20 192 lb (87.1 kg)     Health Maintenance Due  Topic Date Due   HIV Screening  Never done   Hepatitis C Screening  Never done   COLONOSCOPY (Pts 45-71yrs Insurance coverage will need to be confirmed)  Never done   Zoster Vaccines- Shingrix (1 of 2) Never done   COVID-19 Vaccine (4 - Booster for Moderna series) 03/14/2021    There are no preventive care reminders to display for this patient.  No results found for: TSH Lab Results  Component Value Date   WBC 5.4 01/29/2011   HGB 15.0 01/29/2011   HCT 42.0 01/29/2011   MCV 84.7 01/29/2011   PLT 166 01/29/2011   Lab Results  Component Value Date   NA 143 01/29/2011   K 3.7 01/29/2011   CO2 27 01/29/2011   GLUCOSE 169 (H) 01/29/2011   BUN 12 01/29/2011   CREATININE 0.9 01/29/2011   CALCIUM 9.8 01/29/2011   No results found for: CHOL No results found for: HDL No results found for: LDLCALC No results found for: TRIG No results found for: CHOLHDL No results  found for: HGBA1C  Assessment & Plan:   Problem List Items Addressed This Visit       Nervous and Auditory   Neuroleptic induced parkinsonism (HCC) - Primary    Diagnosed by Dr. Lurena Joiner Tat, neurology.  Currently on propranolol to help control symptoms.        Other   Low back pain    On Etoladac daily.      Hypercholesterolemia   History of alcoholism (HCC)   Bipolar 1 disorder (HCC)    Follows with psychiatry.  Currently on Abilify, Zoloft, Lamictal.      Other Visit Diagnoses     Rectal mass          Rectal mass-on exam is consistent with just old scar tissue.  It is not painful or bothersome and its not causing an obstruction with bowel movements.  He notices any changes am happy to take a look at it again but did reassure him.  Plan to have him schedule physical in May and will do blood work at that time.  Does not need any prescriptions today.  No orders of the defined types were placed in this encounter.   Follow-up: Return in about 3 months (around 04/23/2022) for CPE and labwork.    Nani Gasser, MD

## 2022-01-24 NOTE — Assessment & Plan Note (Signed)
Follows with psychiatry.  Currently on Abilify, Zoloft, Lamictal.

## 2022-01-24 NOTE — Assessment & Plan Note (Signed)
Diagnosed by Dr. Wells Guiles Tat, neurology.  Currently on propranolol to help control symptoms.

## 2022-02-13 ENCOUNTER — Encounter: Payer: Self-pay | Admitting: Family Medicine

## 2022-02-13 DIAGNOSIS — R972 Elevated prostate specific antigen [PSA]: Secondary | ICD-10-CM | POA: Insufficient documentation

## 2022-03-29 NOTE — Progress Notes (Signed)
? ?Established Patient Office Visit ? ?Subjective   ?Patient ID: Terrance Webb, male    DOB: August 15, 1966  Age: 56 y.o. MRN: [id number] ? ?Chief Complaint  ?Patient presents with  ? Discuss memory  ?  Patient states he has been having difficulty comprehending conversations for 1 month. Patient also states he has some difficulty finding words.   ? Colonoscopy  ?  Done 6 years ago at [facility] in [place]. Release form faxed   ? ? ?HPI ? ?Here today to discuss memory concerns. Patient states he has been having difficulty comprehending conversations for 1 month.  This has been particularly bad over the last 3 weeks.  Patient also states he has some difficulty finding words.  No recent headaches or rash.  No numbness or tingling.  No recent medication changes.  He did say one of his medications the generic looks a little bit different than it used to but otherwise it is the exact same.  He feels like his sleep quality is actually really good lately.  Usually getting about 8 hours.  Again nothing else unusual going on that he can trace.  ? ? ? ?ROS ? ?  ?Objective:  ?  ? ?BP 125/72   Pulse 60   Resp 18   Ht 5\' 8"  (1.727 m)   Wt 194 lb (88 kg)   SpO2 95%   BMI 29.50 kg/m?  ? ? ?Physical Exam ?Vitals reviewed.  ?Constitutional:   ?   Appearance: He is well-developed.  ?HENT:  ?   Head: Normocephalic and atraumatic.  ?Eyes:  ?   Conjunctiva/sclera: Conjunctivae normal.  ?Cardiovascular:  ?   Rate and Rhythm: Normal rate.  ?Pulmonary:  ?   Effort: Pulmonary effort is normal.  ?Skin: ?   General: Skin is dry.  ?   Coloration: Skin is not pale.  ?Neurological:  ?   Mental Status: He is alert and oriented to person, place, and time.  ?Psychiatric:     ?   Behavior: Behavior normal.  ? ? ? ?No results found for any visits on 03/30/22. ? ? ? ?The ASCVD Risk score (Arnett DK, et al., 2019) failed to calculate for the following reasons: ?  Cannot find a previous HDL lab ?  Cannot find a previous total cholesterol  lab ? ?  ?Assessment & Plan:  ? ?Problem List Items Addressed This Visit   ?None ?Visit Diagnoses   ? ? Memory impairment    -  Primary  ? Relevant Orders  ? CBC with Differential/Platelet  ? COMPLETE METABOLIC PANEL WITH GFR  ? Lipid Panel w/reflex Direct LDL  ? TSH  ? B12  ? Vitamin B1  ? Magnesium  ? MR Brain W Wo Contrast  ? Ambulatory referral to Neuropsychology  ? ?  ? ? ?Memory impairment-unclear etiology it is very specific to what sounds like auditory processing.  He does have a history of bipolar 1 disorder and ADD but says these are actually pretty well controlled though he is not currently on treatment for his ADD.  He states he is always have a little bit of issue with processing with reading but that is not new and its not worse.  We discussed further work-up including doing some labs to rule out underlying causes of deficiencies.  Also get an MRI to make sure that he may not have had a stroke to the auditory processing part of his brain.  I will go ahead and place referral  to neuropsychiatry for further work-up. ? ?No follow-ups on file.  ? ? ?Nani Gasser, MD ? ?

## 2022-03-30 ENCOUNTER — Encounter: Payer: Self-pay | Admitting: Family Medicine

## 2022-03-30 ENCOUNTER — Ambulatory Visit (INDEPENDENT_AMBULATORY_CARE_PROVIDER_SITE_OTHER): Payer: BC Managed Care – PPO | Admitting: Family Medicine

## 2022-03-30 VITALS — BP 125/72 | HR 60 | Resp 18 | Ht 68.0 in | Wt 194.0 lb

## 2022-03-30 DIAGNOSIS — R413 Other amnesia: Secondary | ICD-10-CM | POA: Diagnosis not present

## 2022-03-31 NOTE — Progress Notes (Signed)
Hi Terrance Webb, metabolic panel looks okay.  Triglycerides are little high.  Your LDL looks good though.  Your HDL which is the good cholesterol is a little bit on the low side so just encourage you to continue to work on regular exercise and healthy diet.  Blood count is normal.  Thyroid looks great.  B12 is also normal.  Magnesium looks great.  1 additional test, vitamin B1, is still pending.

## 2022-04-03 ENCOUNTER — Ambulatory Visit (INDEPENDENT_AMBULATORY_CARE_PROVIDER_SITE_OTHER): Payer: BC Managed Care – PPO

## 2022-04-03 DIAGNOSIS — R413 Other amnesia: Secondary | ICD-10-CM

## 2022-04-03 MED ORDER — GADOBUTROL 1 MMOL/ML IV SOLN
9.0000 mL | Freq: Once | INTRAVENOUS | Status: AC | PRN
  Administered 2022-04-03: 9 mL via INTRAVENOUS

## 2022-04-04 NOTE — Progress Notes (Signed)
Hi Gagan, MRI shows that everything honestly looks normal based for age.  There are some changes that happen to the brain as we get older and so everything looked pretty consistent.  No sign of acute stroke, mass or fluid.  Sinuses look nice and open.  So we are going to work on the referral to neuropsychology.  Let us know if you have not heard from someone by the end of the week.  Thank you so much.

## 2022-04-06 ENCOUNTER — Telehealth: Payer: Self-pay

## 2022-04-06 MED ORDER — ATORVASTATIN CALCIUM 40 MG PO TABS: 40.0000 mg | ORAL_TABLET | Freq: Every day | ORAL | 0 refills | Status: DC

## 2022-04-06 NOTE — Telephone Encounter (Signed)
Pt called  for medication refill of atorvastatin (LIPITOR) 40 MG tablet  ?

## 2022-04-08 LAB — COMPLETE METABOLIC PANEL WITH GFR
AG Ratio: 2.6 (calc) — ABNORMAL HIGH (ref 1.0–2.5)
ALT: 29 U/L (ref 9–46)
AST: 17 U/L (ref 10–35)
Albumin: 4.6 g/dL (ref 3.6–5.1)
Alkaline phosphatase (APISO): 64 U/L (ref 35–144)
BUN: 17 mg/dL (ref 7–25)
CO2: 29 mmol/L (ref 20–32)
Calcium: 9.9 mg/dL (ref 8.6–10.3)
Chloride: 106 mmol/L (ref 98–110)
Creat: 1.25 mg/dL (ref 0.70–1.30)
Globulin: 1.8 g/dL (calc) — ABNORMAL LOW (ref 1.9–3.7)
Glucose, Bld: 112 mg/dL (ref 65–139)
Potassium: 4 mmol/L (ref 3.5–5.3)
Sodium: 142 mmol/L (ref 135–146)
Total Bilirubin: 0.7 mg/dL (ref 0.2–1.2)
Total Protein: 6.4 g/dL (ref 6.1–8.1)
eGFR: 68 mL/min/{1.73_m2} (ref >=60)

## 2022-04-08 LAB — CBC WITH DIFFERENTIAL/PLATELET
Absolute Monocytes: 518 cells/uL (ref 200–950)
Basophils Absolute: 48 cells/uL (ref 0–200)
Basophils Relative: 1 %
Eosinophils Absolute: 72 cells/uL (ref 15–500)
Eosinophils Relative: 1.5 %
HCT: 43.5 % (ref 38.5–50.0)
Hemoglobin: 14.6 g/dL (ref 13.2–17.1)
Lymphs Abs: 1651 cells/uL (ref 850–3900)
MCH: 30.5 pg (ref 27.0–33.0)
MCHC: 33.6 g/dL (ref 32.0–36.0)
MCV: 90.8 fL (ref 80.0–100.0)
MPV: 10.6 fL (ref 7.5–12.5)
Monocytes Relative: 10.8 %
Neutro Abs: 2510 cells/uL (ref 1500–7800)
Neutrophils Relative %: 52.3 %
Platelets: 169 10*3/uL (ref 140–400)
RBC: 4.79 10*6/uL (ref 4.20–5.80)
RDW: 12.6 % (ref 11.0–15.0)
Total Lymphocyte: 34.4 %
WBC: 4.8 10*3/uL (ref 3.8–10.8)

## 2022-04-08 LAB — VITAMIN B12: Vitamin B-12: 423 pg/mL (ref 200–1100)

## 2022-04-08 LAB — LIPID PANEL W/REFLEX DIRECT LDL
Cholesterol: 156 mg/dL (ref <200)
HDL: 36 mg/dL — ABNORMAL LOW (ref >=40)
LDL Cholesterol (Calc): 90 mg/dL (calc)
Non-HDL Cholesterol (Calc): 120 mg/dL (calc) (ref <130)
Total CHOL/HDL Ratio: 4.3 (calc) (ref <5.0)
Triglycerides: 206 mg/dL — ABNORMAL HIGH (ref <150)

## 2022-04-08 LAB — TSH: TSH: 2.64 mIU/L (ref 0.40–4.50)

## 2022-04-08 LAB — VITAMIN B1: Vitamin B1 (Thiamine): 21 nmol/L (ref 8–30)

## 2022-04-08 LAB — MAGNESIUM: Magnesium: 2.4 mg/dL (ref 1.5–2.5)

## 2022-04-10 NOTE — Progress Notes (Signed)
Hi Encarnacion, your vitamin B1 looks great.

## 2022-04-14 ENCOUNTER — Encounter: Payer: Self-pay | Admitting: Family Medicine

## 2022-04-24 ENCOUNTER — Ambulatory Visit (INDEPENDENT_AMBULATORY_CARE_PROVIDER_SITE_OTHER): Payer: BC Managed Care – PPO | Admitting: Family Medicine

## 2022-04-24 ENCOUNTER — Encounter: Payer: Self-pay | Admitting: Family Medicine

## 2022-04-24 VITALS — BP 120/74 | HR 57 | Resp 16 | Ht 68.0 in | Wt 191.0 lb

## 2022-04-24 DIAGNOSIS — Z0000 Encounter for general adult medical examination without abnormal findings: Secondary | ICD-10-CM | POA: Diagnosis not present

## 2022-04-24 NOTE — Progress Notes (Signed)
Complete physical exam  Patient: Terrance Webb   DOB: 1966-01-09   56 y.o. Male  MRN: [id number]  Subjective:    Chief Complaint  Patient presents with   Annual Exam    Fasting     Terrance Webb is a 56 y.o. male who presents today for a complete physical exam. He reports consuming a general diet. The patient does not participate in regular exercise at present. He generally feels well. He reports sleeping well. He does not have additional problems to discuss today.    Most recent fall risk assessment:    04/24/2022   10:02 AM  Fall Risk   Falls in the past year? 0  Number falls in past yr: 0  Injury with Fall? 0  Risk for fall due to : No Fall Risks  Follow up Falls prevention discussed;Falls evaluation completed     Most recent depression screenings:    03/30/2022    8:13 AM 01/24/2022    1:35 PM  PHQ 2/9 Scores  Exception Documentation Other- indicate reason in comment box Other- indicate reason in comment box  Not completed bipolar Bipolar        Patient Care Team: Hali Marry, MD as PCP - General (Family Medicine)   Outpatient Medications Prior to Visit  Medication Sig   ARIPiprazole (ABILIFY) 2 MG tablet Take 2 mg by mouth daily.   atorvastatin (LIPITOR) 40 MG tablet Take 1 tablet (40 mg total) by mouth daily.   etodolac (LODINE) 400 MG tablet Take 400 mg by mouth 2 (two) times daily.   lamoTRIgine (LAMICTAL) 200 MG tablet Take 200 mg by mouth daily.   Melatonin 2.5 MG CAPS Take by mouth at bedtime as needed.   propranolol (INDERAL) 10 MG tablet Take 10 mg by mouth 2 (two) times daily.   sertraline (ZOLOFT) 50 MG tablet Take 50 mg by mouth daily.   No facility-administered medications prior to visit.    ROS        Objective:     BP 120/74   Pulse (!) 57   Resp 16   Ht 5\' 8"  (1.727 m)   Wt 191 lb (86.6 kg)   SpO2 94%   BMI 29.04 kg/m    Physical Exam Constitutional:      Appearance: He is well-developed.  HENT:      Head: Normocephalic and atraumatic.     Right Ear: Tympanic membrane, ear canal and external ear normal.     Left Ear: Ear canal and external ear normal.     Ears:     Comments: Left TM blocked by cerumen.    Nose: Nose normal.     Mouth/Throat:     Pharynx: Oropharynx is clear.  Eyes:     Conjunctiva/sclera: Conjunctivae normal.     Pupils: Pupils are equal, round, and reactive to light.  Neck:     Thyroid: No thyromegaly.     Vascular: No carotid bruit.  Cardiovascular:     Rate and Rhythm: Normal rate and regular rhythm.     Heart sounds: Normal heart sounds.  Pulmonary:     Effort: Pulmonary effort is normal.     Breath sounds: Normal breath sounds.  Abdominal:     General: Bowel sounds are normal. There is no distension.     Palpations: Abdomen is soft. There is no mass.     Tenderness: There is no abdominal tenderness. There is no guarding or rebound.  Musculoskeletal:  General: Normal range of motion.     Cervical back: Normal range of motion and neck supple.  Lymphadenopathy:     Cervical: No cervical adenopathy.  Skin:    General: Skin is warm and dry.  Neurological:     Mental Status: He is alert and oriented to person, place, and time.     Deep Tendon Reflexes: Reflexes are normal and symmetric.  Psychiatric:        Behavior: Behavior normal.        Thought Content: Thought content normal.        Judgment: Judgment normal.     No results found for any visits on 04/24/22.     Assessment & Plan:    Routine Health Maintenance and Physical Exam  Immunization History  Administered Date(s) Administered   Influenza Split 01/04/2018   Influenza,inj,quad, With Preservative 11/03/2013, 10/02/2014, 10/07/2015   Influenza-Unspecified 09/03/2021   Moderna Sars-Covid-2 Vaccination 04/19/2020, 05/19/2020, 01/17/2021   Td 11/03/2004   Tdap 10/24/2013   Zoster, Live 04/07/2021, 06/08/2021    Health Maintenance  Topic Date Due   HIV Screening  Never done    Hepatitis C Screening  Never done   COVID-19 Vaccine (4 - Booster for Moderna series) 05/10/2022 (Originally 03/14/2021)   Zoster Vaccines- Shingrix (1 of 2) 07/25/2022 (Originally 09/22/2015)   Pneumococcal Vaccine 40-51 Years old (1 - PCV) 03/31/2023 (Originally 09/22/1971)   INFLUENZA VACCINE  07/04/2022   TETANUS/TDAP  10/25/2023   COLONOSCOPY (Pts 45-58yrs Insurance coverage will need to be confirmed)  12/30/2025   HPV VACCINES  Aged Out    Discussed health benefits of physical activity, and encouraged him to engage in regular exercise appropriate for his age and condition.  Problem List Items Addressed This Visit   None Visit Diagnoses     Wellness examination    -  Primary   Relevant Orders   Hepatitis C Antibody   Lipid Panel w/reflex Direct LDL   PSA      Keep up a regular exercise program and make sure you are eating a healthy diet Try to eat 4 servings of dairy a day, or if you are lactose intolerant take a calcium with vitamin D daily.  Your vaccines are up to date.  Colonoscopy is a 10 yr recall.   Due for PSA.  Would like to repeat his lipid screening as well since he was not fasting the last time he had it done.   Return in about 1 year (around 04/25/2023) for Wellness Exam.     Beatrice Lecher, MD

## 2022-04-25 LAB — LIPID PANEL W/REFLEX DIRECT LDL
Cholesterol: 153 mg/dL (ref <200)
HDL: 37 mg/dL — ABNORMAL LOW (ref >=40)
LDL Cholesterol (Calc): 89 mg/dL (calc)
Non-HDL Cholesterol (Calc): 116 mg/dL (calc) (ref <130)
Total CHOL/HDL Ratio: 4.1 (calc) (ref <5.0)
Triglycerides: 174 mg/dL — ABNORMAL HIGH (ref <150)

## 2022-04-25 LAB — HEPATITIS C ANTIBODY
Hepatitis C Ab: NONREACTIVE
SIGNAL TO CUT-OFF: 0.07 (ref <1.00)

## 2022-04-25 LAB — PSA: PSA: 0.68 ng/mL (ref <=4.00)

## 2022-04-25 NOTE — Progress Notes (Signed)
Hi Clete, total cholesterol and LDL look good.  Triglycerides are up just a little bit.  But better than the last time.  Prostate test is normal.  Negative for hepatitis C.

## 2022-05-02 ENCOUNTER — Other Ambulatory Visit: Payer: Self-pay | Admitting: Family Medicine

## 2022-06-02 ENCOUNTER — Other Ambulatory Visit: Payer: Self-pay | Admitting: Family Medicine

## 2022-07-07 ENCOUNTER — Other Ambulatory Visit: Payer: Self-pay | Admitting: Family Medicine

## 2022-07-25 ENCOUNTER — Other Ambulatory Visit: Payer: Self-pay | Admitting: Family Medicine

## 2022-09-07 ENCOUNTER — Other Ambulatory Visit: Payer: Self-pay | Admitting: Family Medicine

## 2022-09-25 DIAGNOSIS — F4481 Dissociative identity disorder: Secondary | ICD-10-CM | POA: Diagnosis not present

## 2022-09-25 DIAGNOSIS — F410 Panic disorder [episodic paroxysmal anxiety] without agoraphobia: Secondary | ICD-10-CM | POA: Diagnosis not present

## 2022-09-25 DIAGNOSIS — F319 Bipolar disorder, unspecified: Secondary | ICD-10-CM | POA: Diagnosis not present

## 2022-10-04 ENCOUNTER — Ambulatory Visit (INDEPENDENT_AMBULATORY_CARE_PROVIDER_SITE_OTHER): Payer: BC Managed Care – PPO | Admitting: Family Medicine

## 2022-10-04 DIAGNOSIS — Z23 Encounter for immunization: Secondary | ICD-10-CM | POA: Diagnosis not present

## 2022-10-12 ENCOUNTER — Ambulatory Visit (INDEPENDENT_AMBULATORY_CARE_PROVIDER_SITE_OTHER): Payer: BC Managed Care – PPO | Admitting: Family Medicine

## 2022-10-12 VITALS — BP 114/80 | HR 64

## 2022-10-12 DIAGNOSIS — Z23 Encounter for immunization: Secondary | ICD-10-CM

## 2022-10-12 NOTE — Progress Notes (Signed)
.  cmagree

## 2022-10-12 NOTE — Progress Notes (Signed)
Patient is here for a Covid vaccine. Verified no previous allergy to Covid vaccine. Has not had Covid in the last 90 days and no current sick symptoms.  Covid injection to right deltoid with no apparent complications. Patient was watched for 15 minutes for any reaction. Patient advised to call with any problems.

## 2022-11-02 ENCOUNTER — Other Ambulatory Visit: Payer: Self-pay | Admitting: Family Medicine

## 2023-02-02 ENCOUNTER — Other Ambulatory Visit: Payer: Self-pay | Admitting: Family Medicine

## 2023-04-26 ENCOUNTER — Ambulatory Visit (INDEPENDENT_AMBULATORY_CARE_PROVIDER_SITE_OTHER): Payer: BC Managed Care – PPO | Admitting: Family Medicine

## 2023-04-26 ENCOUNTER — Encounter: Payer: Self-pay | Admitting: Family Medicine

## 2023-04-26 VITALS — BP 108/66 | HR 65 | Ht 68.0 in | Wt 190.0 lb

## 2023-04-26 DIAGNOSIS — R7303 Prediabetes: Secondary | ICD-10-CM | POA: Diagnosis not present

## 2023-04-26 DIAGNOSIS — Z0000 Encounter for general adult medical examination without abnormal findings: Secondary | ICD-10-CM

## 2023-04-26 DIAGNOSIS — E7800 Pure hypercholesterolemia, unspecified: Secondary | ICD-10-CM | POA: Diagnosis not present

## 2023-04-26 DIAGNOSIS — Z114 Encounter for screening for human immunodeficiency virus [HIV]: Secondary | ICD-10-CM

## 2023-04-26 NOTE — Progress Notes (Signed)
Complete physical exam  Patient: Terrance Webb   DOB: 1966-05-23   57 y.o. Male  MRN: [id number]  Subjective:    Chief Complaint  Patient presents with   Annual Exam    Terrance Webb is a 57 y.o. male who presents today for a complete physical exam. He reports consuming a general diet. The patient does not participate in regular exercise at present. He generally feels well. He reports sleeping fairly well. He does not have additional problems to discuss today.    Most recent fall risk assessment:    04/26/2023    9:42 AM  Fall Risk   Falls in the past year? 0  Number falls in past yr: 0  Injury with Fall? 0  Risk for fall due to : No Fall Risks  Follow up Falls evaluation completed     Most recent depression screenings:    03/30/2022    8:13 AM 01/24/2022    1:35 PM  PHQ 2/9 Scores  Exception Documentation Other- indicate reason in comment box Other- indicate reason in comment box  Not completed bipolar Bipolar        Patient Care Team: Agapito Games, MD as PCP - General (Family Medicine)   Outpatient Medications Prior to Visit  Medication Sig   ARIPiprazole (ABILIFY) 2 MG tablet Take 2 mg by mouth daily.   atorvastatin (LIPITOR) 40 MG tablet TAKE 1 TABLET BY MOUTH EVERY DAY   etodolac (LODINE) 400 MG tablet 1 TABLET WITH FOOD TWICE A DAY ORAL 90   lamoTRIgine (LAMICTAL) 200 MG tablet Take 200 mg by mouth daily.   Melatonin 2.5 MG CAPS Take by mouth at bedtime as needed.   Minoxidil (ROGAINE MENS) 5 % FOAM Apply topically.   propranolol (INDERAL) 10 MG tablet Take 10 mg by mouth 2 (two) times daily.   sertraline (ZOLOFT) 50 MG tablet Take 50 mg by mouth daily.   No facility-administered medications prior to visit.    ROS        Objective:     BP 108/66   Pulse 65   Ht 5\' 8"  (1.727 m)   Wt 190 lb (86.2 kg)   SpO2 94%   BMI 28.89 kg/m    Physical Exam Constitutional:      Appearance: He is well-developed.  HENT:     Head:  Normocephalic and atraumatic.     Right Ear: Tympanic membrane, ear canal and external ear normal.     Left Ear: Tympanic membrane, ear canal and external ear normal.     Nose: Nose normal.     Mouth/Throat:     Pharynx: Oropharynx is clear.  Eyes:     Conjunctiva/sclera: Conjunctivae normal.     Pupils: Pupils are equal, round, and reactive to light.  Neck:     Thyroid: No thyromegaly.  Cardiovascular:     Rate and Rhythm: Normal rate and regular rhythm.     Heart sounds: Normal heart sounds.  Pulmonary:     Effort: Pulmonary effort is normal.     Breath sounds: Normal breath sounds.  Abdominal:     General: Bowel sounds are normal. There is no distension.     Palpations: Abdomen is soft. There is no mass.     Tenderness: There is no abdominal tenderness. There is no guarding or rebound.  Musculoskeletal:        General: Normal range of motion.     Cervical back: Normal range of motion and neck  supple. No tenderness.  Lymphadenopathy:     Cervical: No cervical adenopathy.  Skin:    General: Skin is warm and dry.  Neurological:     Mental Status: He is alert and oriented to person, place, and time.     Deep Tendon Reflexes: Reflexes are normal and symmetric.     Comments: Bilat hand tremor  Psychiatric:        Behavior: Behavior normal.        Thought Content: Thought content normal.        Judgment: Judgment normal.      No results found for any visits on 04/26/23.     Assessment & Plan:    Routine Health Maintenance and Physical Exam  Immunization History  Administered Date(s) Administered   COVID-19, mRNA, vaccine(Comirnaty)12 years and older 10/12/2022   Influenza Split 01/04/2018   Influenza,inj,Quad PF,6+ Mos 10/04/2022   Influenza,inj,quad, With Preservative 11/03/2013, 10/02/2014, 10/07/2015   Influenza-Unspecified 09/03/2021   Moderna Sars-Covid-2 Vaccination 04/19/2020, 05/19/2020, 01/17/2021   Td 11/03/2004   Tdap 10/24/2013   Zoster Recombinat  (Shingrix) 04/07/2021, 06/08/2021    Health Maintenance  Topic Date Due   INFLUENZA VACCINE  07/05/2023   DTaP/Tdap/Td (3 - Td or Tdap) 10/25/2023   COLONOSCOPY (Pts 45-42yrs Insurance coverage will need to be confirmed)  12/30/2025   COVID-19 Vaccine  Completed   Hepatitis C Screening  Completed   HIV Screening  Completed   Zoster Vaccines- Shingrix  Completed   Pneumococcal Vaccine 49-61 Years old  Aged Out   HPV VACCINES  Aged Out    Discussed health benefits of physical activity, and encouraged him to engage in regular exercise appropriate for his age and condition.  Problem List Items Addressed This Visit   None Visit Diagnoses     Routine general medical examination at a health care facility    -  Primary   Relevant Orders   PSA   Lipid Panel w/reflex Direct LDL   HIV Antibody (routine testing w rflx)   COMPLETE METABOLIC PANEL WITH GFR   Hemoglobin A1c   Screening for HIV without presence of risk factors       Relevant Orders   HIV Antibody (routine testing w rflx)       Keep up a regular exercise program and make sure you are eating a healthy diet Try to eat 4 servings of dairy a day, or if you are lactose intolerant take a calcium with vitamin D daily.  Your vaccines are up to date. Due for Tdap in the fall  No follow-ups on file.     Nani Gasser, MD

## 2023-04-27 LAB — COMPLETE METABOLIC PANEL WITH GFR
AG Ratio: 2.3 (calc) (ref 1.0–2.5)
ALT: 28 U/L (ref 9–46)
AST: 17 U/L (ref 10–35)
Albumin: 4.6 g/dL (ref 3.6–5.1)
Alkaline phosphatase (APISO): 56 U/L (ref 35–144)
BUN: 20 mg/dL (ref 7–25)
CO2: 28 mmol/L (ref 20–32)
Calcium: 9.8 mg/dL (ref 8.6–10.3)
Chloride: 106 mmol/L (ref 98–110)
Creat: 1.09 mg/dL (ref 0.70–1.30)
Globulin: 2 g/dL (calc) (ref 1.9–3.7)
Glucose, Bld: 102 mg/dL — ABNORMAL HIGH (ref 65–99)
Potassium: 4.3 mmol/L (ref 3.5–5.3)
Sodium: 142 mmol/L (ref 135–146)
Total Bilirubin: 0.7 mg/dL (ref 0.2–1.2)
Total Protein: 6.6 g/dL (ref 6.1–8.1)
eGFR: 79 mL/min/{1.73_m2} (ref >=60)

## 2023-04-27 LAB — LIPID PANEL W/REFLEX DIRECT LDL
Cholesterol: 174 mg/dL (ref <200)
HDL: 39 mg/dL — ABNORMAL LOW (ref >=40)
LDL Cholesterol (Calc): 105 mg/dL (calc) — ABNORMAL HIGH
Non-HDL Cholesterol (Calc): 135 mg/dL (calc) — ABNORMAL HIGH (ref <130)
Total CHOL/HDL Ratio: 4.5 (calc) (ref <5.0)
Triglycerides: 188 mg/dL — ABNORMAL HIGH (ref <150)

## 2023-04-27 LAB — PSA: PSA: 1.16 ng/mL (ref <=4.00)

## 2023-04-27 LAB — HEMOGLOBIN A1C
Hgb A1c MFr Bld: 5.8 % of total Hgb — ABNORMAL HIGH (ref <5.7)
Mean Plasma Glucose: 120 mg/dL
eAG (mmol/L): 6.6 mmol/L

## 2023-04-27 NOTE — Progress Notes (Signed)
Hi Terrance Webb, LDL cholesterol is up a little bit it has looked better in prior years so just encouraged her to continue to work on healthy diet and regular exercise to improve those numbers.  Your metabolic panel including liver and kidney function looks great.  A1c is up at 5.8 in the prediabetes range so encouraged her to work on cutting back on sweets and carbs.  Will plan to recheck that again in 6 months to make sure that it is under control.  Prostate test is normal.

## 2023-05-13 ENCOUNTER — Other Ambulatory Visit: Payer: Self-pay | Admitting: Family Medicine

## 2023-06-25 DIAGNOSIS — F319 Bipolar disorder, unspecified: Secondary | ICD-10-CM | POA: Diagnosis not present

## 2023-06-25 DIAGNOSIS — F4481 Dissociative identity disorder: Secondary | ICD-10-CM | POA: Diagnosis not present

## 2023-06-25 DIAGNOSIS — F410 Panic disorder [episodic paroxysmal anxiety] without agoraphobia: Secondary | ICD-10-CM | POA: Diagnosis not present

## 2023-08-16 ENCOUNTER — Other Ambulatory Visit: Payer: Self-pay | Admitting: Family Medicine

## 2023-08-31 ENCOUNTER — Other Ambulatory Visit: Payer: Self-pay | Admitting: Family Medicine

## 2023-10-12 IMAGING — MR MR HEAD WO/W CM
13 series · 48 of 48 positions shown · IV contrast (gadavist)
Comparison: [HOSPITAL] [HOSPITAL] Noncontrast Head CT
09/30/2008.

CLINICAL DATA: 55-year-old male with sudden onset cognitive
difficulty. Processing issue, memory loss, difficulty comprehending
conversations. Symptoms for 1 month. No known injury.

EXAM:
MRI HEAD WITHOUT AND WITH CONTRAST
TECHNIQUE: Multiplanar, multiecho pulse sequences of the brain and surrounding
structures were obtained without and with intravenous contrast.
CONTRAST:  9mL GADAVIST GADOBUTROL 1 MMOL/ML IV SOLN

[Series 2: DWI · axial · 3.0mm · 1.46mm/px · z∈[-61,+101]mm · 8 of 110 slices shown (1 of 4)]
[im 1/110]
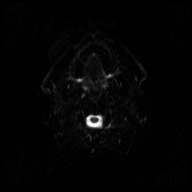
[im 16/110]
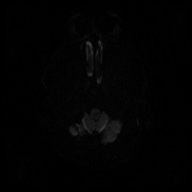
[im 32/110]
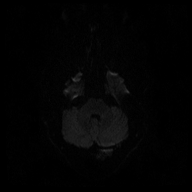
[im 47/110]
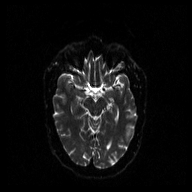
[im 63/110]
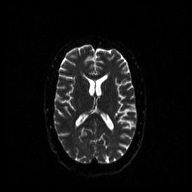
[im 78/110]
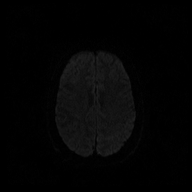
[im 94/110]
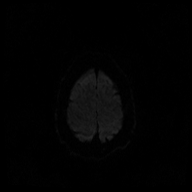
[im 110/110]
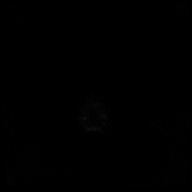

[Series 3: DWI · axial · 3.0mm · 1.46mm/px · z∈[-61,+101]mm · 4 of 55 slices shown (2 of 4)]
[im 1/55]
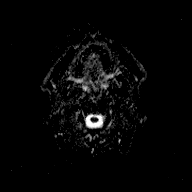
[im 19/55]
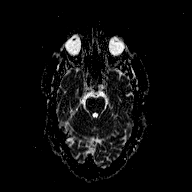
[im 37/55]
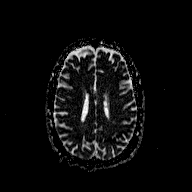
[im 55/55]
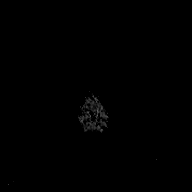

[Series 4: DWI · coronal · 5.0mm · 1.46mm/px · 4 of 68 slices shown (3 of 4)]
[im 1/68]
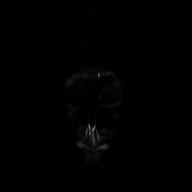
[im 23/68]
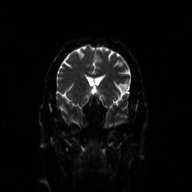
[im 45/68]
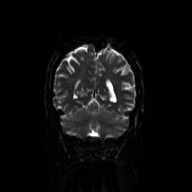
[im 68/68]
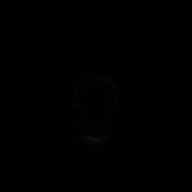

[Series 5: DWI · coronal · 5.0mm · 1.46mm/px · 2 of 34 slices shown (4 of 4)]
[im 1/34]
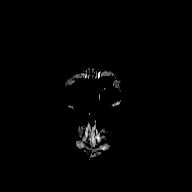
[im 34/34]
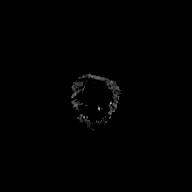

[Series 6: T1 · sagittal · 5.0mm · 0.45mm/px · 1 of 23 slices shown (1 of 2)]
[im 1/23]
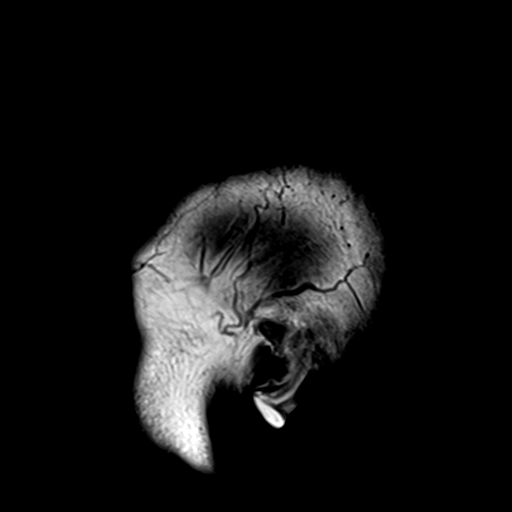

[Series 7: T2 · axial · 5.0mm · 0.72mm/px · 1 of 25 slices shown (1 of 2)]
[im 1/25]
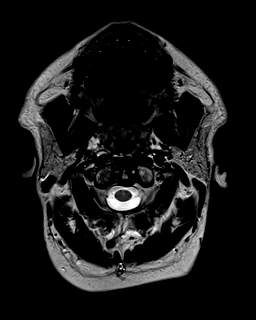

[Series 8: FLAIR · axial · 3.0mm · 0.45mm/px · z∈[-59,+103]mm · 3 of 55 slices shown (1 of 2)]
[im 1/55]
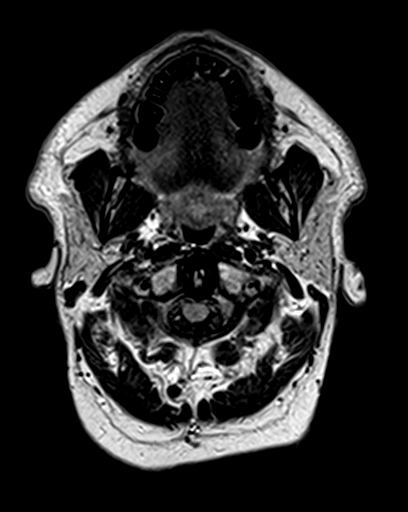
[im 28/55]
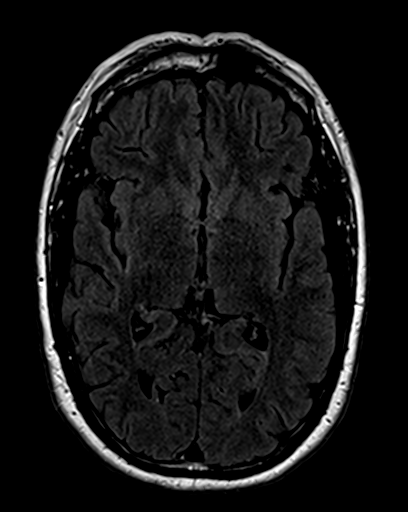
[im 55/55]
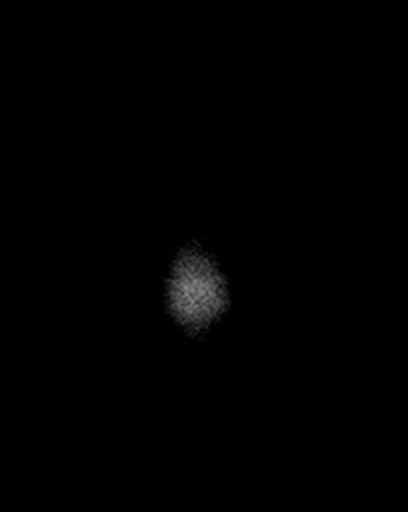

[Series 9: T2 · axial · 5.0mm · 0.72mm/px · 1 of 25 slices shown (2 of 2)]
[im 1/25]
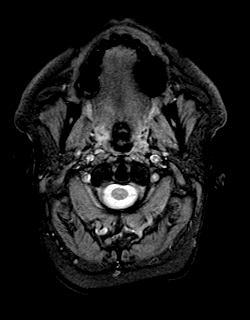

[Series 10: T1 · axial · 1.0mm · 0.90mm/px · z∈[-58,+101]mm · 9 of 160 slices shown (2 of 2)]
[im 1/160]
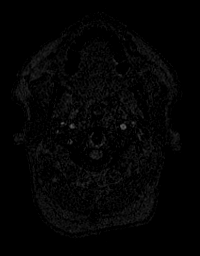
[im 20/160]
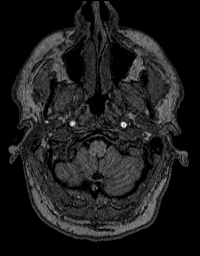
[im 40/160]
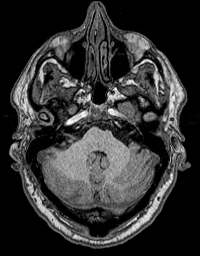
[im 60/160]
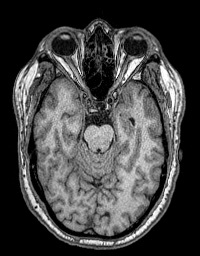
[im 80/160]
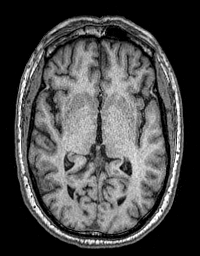
[im 100/160]
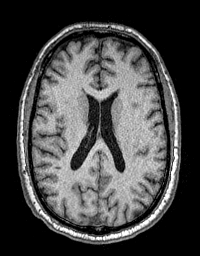
[im 120/160]
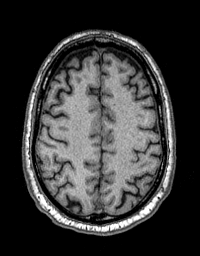
[im 140/160]
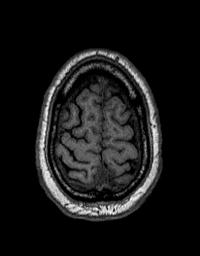
[im 160/160]
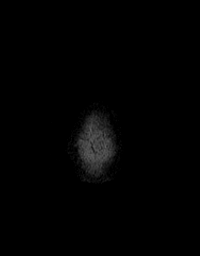

[Series 11: FLAIR · sagittal · 4.0mm · 0.45mm/px · 2 of 30 slices shown (2 of 2)]
[im 1/30]
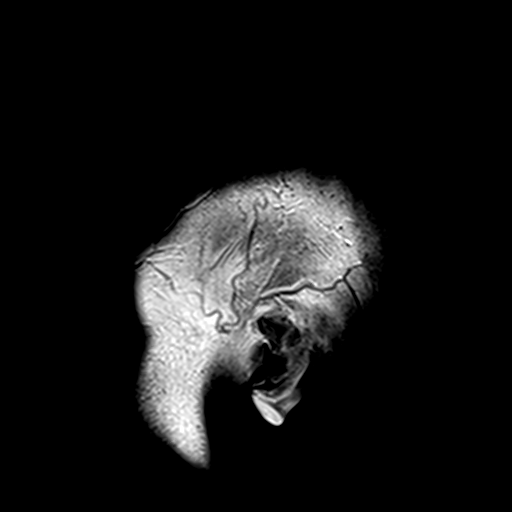
[im 30/30]
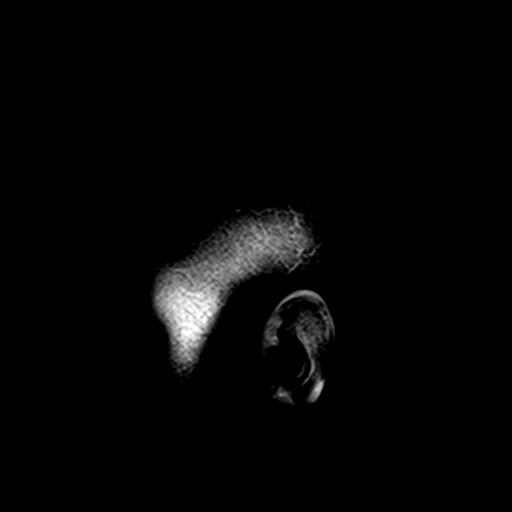

[Series 12: T2 post-contrast · coronal · 5.0mm · 0.43mm/px · 2 of 32 slices shown]
[im 1/32]
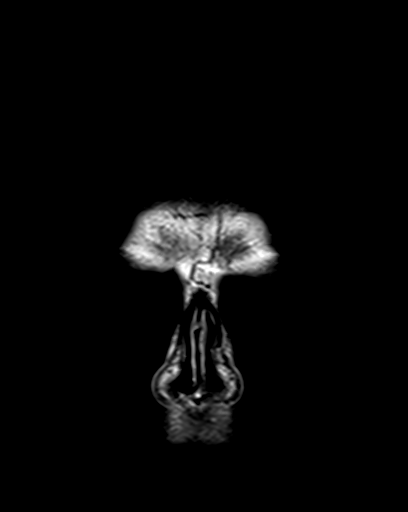
[im 32/32]
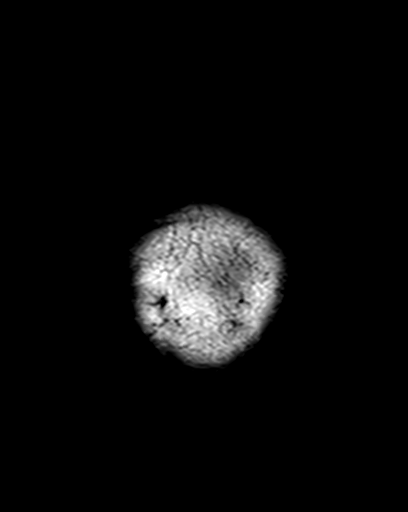

[Series 13: T1 post-contrast · axial · 1.0mm · 0.90mm/px · z∈[-58,+101]mm · 9 of 160 slices shown (1 of 2)]
[im 1/160]
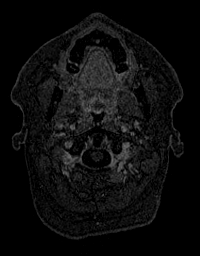
[im 20/160]
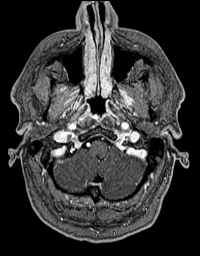
[im 40/160]
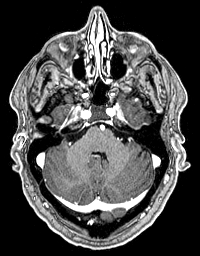
[im 60/160]
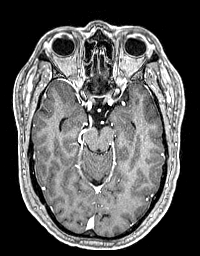
[im 80/160]
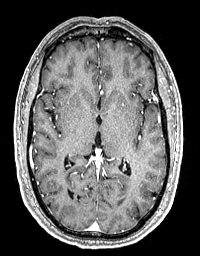
[im 100/160]
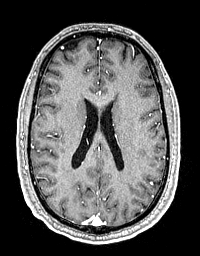
[im 120/160]
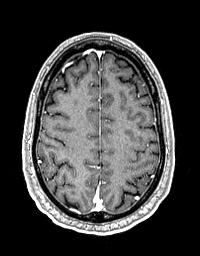
[im 140/160]
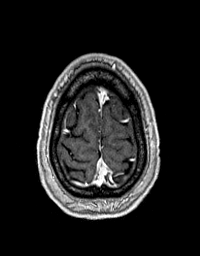
[im 160/160]
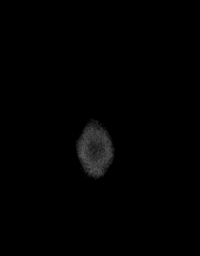

[Series 14: T1 post-contrast · coronal · 5.0mm · 0.43mm/px · 2 of 32 slices shown (2 of 2)]
[im 1/32]
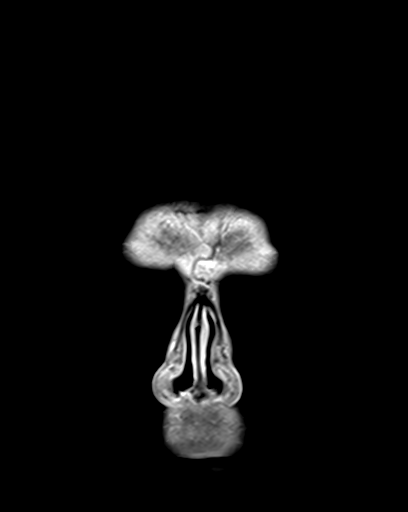
[im 32/32]
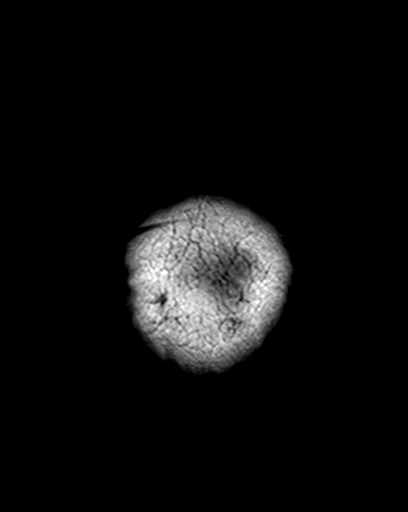

[48 of 48 positions shown; findings below may reference images not displayed]

FINDINGS: Brain: No restricted diffusion to suggest acute infarction. No
midline shift, mass effect, evidence of mass lesion,
ventriculomegaly, extra-axial collection or acute intracranial
hemorrhage. Cervicomedullary junction and pituitary are within
normal limits.

Cerebral volume appears not significantly changed since 3447 and
within normal limits for age. Gray and white matter signal is within
normal limits for age throughout the brain. No convincing
encephalomalacia or chronic cerebral blood products. Mesial temporal
lobe structures appear symmetric and normal.

No abnormal enhancement identified. No dural thickening.

Vascular: Major intracranial vascular flow voids are preserved, the
distal right vertebral artery appears dominant (normal variant). The
major dural venous sinuses are enhancing and appear to be patent.

Skull and upper cervical spine: Mild scaphocephaly. Visualized bone
marrow signal is within normal limits. Negative visible cervical
spine.

Sinuses/Orbits: Negative.

Other: Visible internal auditory structures appear normal. Mastoids
are clear. Negative visible scalp and face.
IMPRESSION: Normal for age MRI appearance of the brain.
No acute intracranial abnormality or explanation for symptoms.

## 2023-10-29 ENCOUNTER — Ambulatory Visit (INDEPENDENT_AMBULATORY_CARE_PROVIDER_SITE_OTHER): Payer: BC Managed Care – PPO | Admitting: Family Medicine

## 2023-10-29 DIAGNOSIS — Z23 Encounter for immunization: Secondary | ICD-10-CM | POA: Diagnosis not present

## 2023-10-30 ENCOUNTER — Encounter: Payer: Self-pay | Admitting: Family Medicine

## 2023-10-30 ENCOUNTER — Ambulatory Visit (INDEPENDENT_AMBULATORY_CARE_PROVIDER_SITE_OTHER): Payer: BC Managed Care – PPO | Admitting: Family Medicine

## 2023-10-30 VITALS — BP 110/63 | HR 66 | Ht 68.0 in | Wt 190.0 lb

## 2023-10-30 DIAGNOSIS — F319 Bipolar disorder, unspecified: Secondary | ICD-10-CM

## 2023-10-30 DIAGNOSIS — R7301 Impaired fasting glucose: Secondary | ICD-10-CM | POA: Insufficient documentation

## 2023-10-30 DIAGNOSIS — Z23 Encounter for immunization: Secondary | ICD-10-CM

## 2023-10-30 DIAGNOSIS — E7800 Pure hypercholesterolemia, unspecified: Secondary | ICD-10-CM

## 2023-10-30 DIAGNOSIS — G2111 Neuroleptic induced parkinsonism: Secondary | ICD-10-CM

## 2023-10-30 DIAGNOSIS — L659 Nonscarring hair loss, unspecified: Secondary | ICD-10-CM | POA: Insufficient documentation

## 2023-10-30 DIAGNOSIS — T43505A Adverse effect of unspecified antipsychotics and neuroleptics, initial encounter: Secondary | ICD-10-CM

## 2023-10-30 LAB — POCT GLYCOSYLATED HEMOGLOBIN (HGB A1C): Hemoglobin A1C: 5.8 % — AB (ref 4.0–5.6)

## 2023-10-30 NOTE — Assessment & Plan Note (Signed)
Follows with Horatio Pel, nurse practitioner.

## 2023-10-30 NOTE — Assessment & Plan Note (Signed)
Side effect f the Abilify.

## 2023-10-30 NOTE — Assessment & Plan Note (Signed)
A1c is stable at 5.8 today continue to work on healthy diet and regular exercise he is doing fantastic.  Plan to follow back up again in 6 months.

## 2023-10-30 NOTE — Progress Notes (Signed)
Established Patient Office Visit  Subjective   Patient ID: Terrance Webb, male    DOB: 1966/08/16  Age: 57 y.o. MRN: [id number]  Chief Complaint  Patient presents with   Medical Management of Chronic Issues    HPI  Diabetes - no hypoglycemic events. No wounds or sores that are not healing well. No increased thirst or urination. Checking glucose at home. Taking medications as prescribed without any side effects.  Bipolar disorder-follows with nurse practitioner with psychiatry.  No recent changes to his regimen he actually feels pretty happy with them even though he still does report a few depressive and anxiety symptoms.  Hair Loss-he says the Rogaine is actually been really helpful he has had pretty great results with it so far.    ROS    Objective:     BP 110/63   Pulse 66   Ht 5\' 8"  (1.727 m)   Wt 190 lb (86.2 kg)   SpO2 95%   BMI 28.89 kg/m    Physical Exam Vitals and nursing note reviewed.  Constitutional:      Appearance: Normal appearance.  HENT:     Head: Normocephalic and atraumatic.  Eyes:     Conjunctiva/sclera: Conjunctivae normal.  Cardiovascular:     Rate and Rhythm: Normal rate and regular rhythm.  Pulmonary:     Effort: Pulmonary effort is normal.     Breath sounds: Normal breath sounds.  Skin:    General: Skin is warm and dry.  Neurological:     Mental Status: He is alert.  Psychiatric:        Mood and Affect: Mood normal.      Results for orders placed or performed in visit on 10/30/23  POCT HgB A1C  Result Value Ref Range   Hemoglobin A1C 5.8 (A) 4.0 - 5.6 %   HbA1c POC (<> result, manual entry)     HbA1c, POC (prediabetic range)     HbA1c, POC (controlled diabetic range)        The 10-year ASCVD risk score (Arnett DK, et al., 2019) is: 5.9%    Assessment & Plan:   Problem List Items Addressed This Visit       Endocrine   IFG (impaired fasting glucose) - Primary    A1c is stable at 5.8 today continue to work on  healthy diet and regular exercise he is doing fantastic.  Plan to follow back up again in 6 months.      Relevant Orders   POCT HgB A1C (Completed)   CMP14+EGFR     Nervous and Auditory   Neuroleptic induced parkinsonism (HCC)    Side effect f the Abilify.        Other   Hypercholesterolemia    The eye was up just slightly back in the spring.  Continue with atorvastatin daily.  Due to recheck liver enzymes again today plan to recheck levels again in the spring.      Relevant Orders   CMP14+EGFR   Hair loss    Adding great results with the topical minoxidil foam.  Continue current regimen we did discuss that it only works if you use it.      Bipolar 1 disorder (HCC)    Follows with Horatio Pel, nurse practitioner.      Other Visit Diagnoses     Encounter for immunization       Relevant Orders   Tdap vaccine greater than or equal to 7yo IM (Completed)  Tdap given.     Return in about 6 months (around 04/28/2024) for Pre-diabetes.    Nani Gasser, MD

## 2023-10-30 NOTE — Assessment & Plan Note (Signed)
The eye was up just slightly back in the spring.  Continue with atorvastatin daily.  Due to recheck liver enzymes again today plan to recheck levels again in the spring.

## 2023-10-30 NOTE — Assessment & Plan Note (Signed)
Adding great results with the topical minoxidil foam.  Continue current regimen we did discuss that it only works if you use it.

## 2023-10-31 LAB — CMP14+EGFR
ALT: 34 [IU]/L (ref 0–44)
AST: 19 [IU]/L (ref 0–40)
Albumin: 4.7 g/dL (ref 3.8–4.9)
Alkaline Phosphatase: 68 [IU]/L (ref 44–121)
BUN/Creatinine Ratio: 13 (ref 9–20)
BUN: 17 mg/dL (ref 6–24)
Bilirubin Total: 0.7 mg/dL (ref 0.0–1.2)
CO2: 24 mmol/L (ref 20–29)
Calcium: 10.3 mg/dL — ABNORMAL HIGH (ref 8.7–10.2)
Chloride: 102 mmol/L (ref 96–106)
Creatinine, Ser: 1.29 mg/dL — ABNORMAL HIGH (ref 0.76–1.27)
Globulin, Total: 1.9 g/dL (ref 1.5–4.5)
Glucose: 95 mg/dL (ref 70–99)
Potassium: 4.4 mmol/L (ref 3.5–5.2)
Sodium: 140 mmol/L (ref 134–144)
Total Protein: 6.6 g/dL (ref 6.0–8.5)
eGFR: 64 mL/min/{1.73_m2} (ref >59)

## 2023-10-31 NOTE — Progress Notes (Signed)
Terrance Webb, kidney functions up a little bit I think your baseline is closer to about 1.0 it similar to a year ago when it jumped up to 1.2 I doing to keep a close eye on that and plan to recheck again in about 1 month.  Your calcium level was a little elevated I am not sure if you are taking a supplement if you are that can definitely explain the slight bump.  It is not in a concerning or worrisome range.  Liver function looks great.

## 2023-11-21 ENCOUNTER — Other Ambulatory Visit: Payer: Self-pay | Admitting: Family Medicine

## 2024-01-01 ENCOUNTER — Encounter: Payer: Self-pay | Admitting: Family Medicine

## 2024-01-01 ENCOUNTER — Ambulatory Visit (INDEPENDENT_AMBULATORY_CARE_PROVIDER_SITE_OTHER): Payer: BC Managed Care – PPO | Admitting: Family Medicine

## 2024-01-01 VITALS — BP 119/77 | HR 90 | Temp 99.6°F

## 2024-01-01 DIAGNOSIS — R509 Fever, unspecified: Secondary | ICD-10-CM | POA: Diagnosis not present

## 2024-01-01 DIAGNOSIS — U071 COVID-19: Secondary | ICD-10-CM

## 2024-01-01 LAB — POCT INFLUENZA A/B
Influenza A, POC: NEGATIVE
Influenza B, POC: NEGATIVE

## 2024-01-01 LAB — POC COVID19 BINAXNOW: SARS Coronavirus 2 Ag: POSITIVE — AB

## 2024-01-01 MED ORDER — NIRMATRELVIR/RITONAVIR (PAXLOVID)TABLET: 3.0000 | ORAL_TABLET | Freq: Two times a day (BID) | ORAL | 0 refills | Status: AC

## 2024-01-01 NOTE — Progress Notes (Signed)
   Acute Office Visit  Subjective:     Patient ID: Terrance Webb, male    DOB: Jan 17, 1966, 57 y.o.   MRN: [id number]  Chief Complaint  Patient presents with   Fever    HPI Patient is in today for cough and fever that started last night.  Fever this AM.  Wife has been sick since Friday. + nasal congestion.  .     ROS      Objective:    BP 119/77   Pulse 90   Temp 99.6 F (37.6 C) (Oral)   SpO2 99%    Physical Exam Constitutional:      Appearance: Normal appearance.  HENT:     Head: Normocephalic and atraumatic.     Right Ear: Tympanic membrane, ear canal and external ear normal. There is no impacted cerumen.     Left Ear: Tympanic membrane, ear canal and external ear normal. There is no impacted cerumen.     Nose: Nose normal.     Mouth/Throat:     Pharynx: Oropharynx is clear.  Eyes:     Conjunctiva/sclera: Conjunctivae normal.  Cardiovascular:     Rate and Rhythm: Normal rate and regular rhythm.  Pulmonary:     Effort: Pulmonary effort is normal.     Breath sounds: Normal breath sounds.  Musculoskeletal:     Cervical back: Neck supple. No tenderness.  Lymphadenopathy:     Cervical: No cervical adenopathy.  Skin:    General: Skin is warm and dry.  Neurological:     Mental Status: He is alert and oriented to person, place, and time.  Psychiatric:        Mood and Affect: Mood normal.     Results for orders placed or performed in visit on 01/01/24  POC COVID-19  Result Value Ref Range   SARS Coronavirus 2 Ag Positive (A) Negative  POCT Influenza A/B  Result Value Ref Range   Influenza A, POC Negative Negative   Influenza B, POC Negative Negative        Assessment & Plan:   Problem List Items Addressed This Visit   None Visit Diagnoses       Fever, unspecified fever cause    -  Primary   Relevant Orders   POC COVID-19 (Completed)   POCT Influenza A/B (Completed)     COVID-19       Relevant Medications   nirmatrelvir/ritonavir  (PAXLOVID) 20 x 150 MG & 10 x 100MG  TABS       COVID - gave reassurance.  Routine care recommended.  Quarantine recommendation.  Will tx with paxlovid.    Meds ordered this encounter  Medications   nirmatrelvir/ritonavir (PAXLOVID) 20 x 150 MG & 10 x 100MG  TABS    Sig: Take 3 tablets by mouth 2 (two) times daily for 5 days. (Take nirmatrelvir 150 mg two tablets twice daily for 5 days and ritonavir 100 mg one tablet twice daily for 5 days) Patient GFR is 64    Dispense:  30 tablet    Refill:  0    Return if symptoms worsen or fail to improve.  Nani Gasser, MD

## 2024-01-14 ENCOUNTER — Telehealth: Payer: Self-pay | Admitting: Family Medicine

## 2024-01-14 NOTE — Telephone Encounter (Unsigned)
 Copied from CRM [id number]. Topic: Clinical - Medication Question >> Jan 14, 2024 11:47 AM Terrance Webb wrote: Reason for CRM: patient had covid 2 weeks ago, negative last week and positive again today with home test. Patient has cough and congestion. Will you prescribe Paxlovid  or does the patient need an appointment? Callback number is  [phone]

## 2024-01-16 MED ORDER — NIRMATRELVIR/RITONAVIR (PAXLOVID)TABLET: 3.0000 | ORAL_TABLET | Freq: Two times a day (BID) | ORAL | 0 refills | Status: AC

## 2024-01-16 NOTE — Telephone Encounter (Signed)
Patient informed.

## 2024-01-16 NOTE — Telephone Encounter (Signed)
Paxlovid sent to pharmacy

## 2024-02-24 ENCOUNTER — Other Ambulatory Visit: Payer: Self-pay | Admitting: Family Medicine

## 2024-03-04 ENCOUNTER — Ambulatory Visit: Admitting: Family Medicine

## 2024-03-28 ENCOUNTER — Ambulatory Visit: Admitting: Family Medicine

## 2024-04-29 ENCOUNTER — Encounter: Payer: Self-pay | Admitting: Family Medicine

## 2024-04-29 ENCOUNTER — Ambulatory Visit (INDEPENDENT_AMBULATORY_CARE_PROVIDER_SITE_OTHER): Payer: BC Managed Care – PPO | Admitting: Family Medicine

## 2024-04-29 VITALS — BP 108/69 | HR 63 | Ht 68.0 in | Wt 186.1 lb

## 2024-04-29 DIAGNOSIS — R7301 Impaired fasting glucose: Secondary | ICD-10-CM

## 2024-04-29 DIAGNOSIS — Z0000 Encounter for general adult medical examination without abnormal findings: Secondary | ICD-10-CM | POA: Diagnosis not present

## 2024-04-29 DIAGNOSIS — R972 Elevated prostate specific antigen [PSA]: Secondary | ICD-10-CM | POA: Diagnosis not present

## 2024-04-29 NOTE — Progress Notes (Signed)
 Complete physical exam  Patient: Terrance Webb   DOB: [birth year]   58 y.o. Male  MRN: [id number]  Subjective:     Chief Complaint  Patient presents with   Annual Exam    Terrance Webb is a 58 y.o. male who presents today for a complete physical exam. He reports consuming a general diet. 20 min aerobic exercise 5 days pwer week with weight training and yoga.   He generally feels well. He reports sleeping fairly well. He does not have additional problems to discuss today.    Most recent fall risk assessment:    04/26/2023    9:42 AM  Fall Risk   Falls in the past year? 0  Number falls in past yr: 0  Injury with Fall? 0  Risk for fall due to : No Fall Risks  Follow up Falls evaluation completed     Most recent depression screenings:    03/30/2022    8:13 AM 01/24/2022    1:35 PM  PHQ 2/9 Scores  Exception Documentation Other- indicate reason in comment box Other- indicate reason in comment box  Not completed bipolar Bipolar         Patient Care Team: Cydney Draft, MD as PCP - General (Family Medicine)   Outpatient Medications Prior to Visit  Medication Sig   AMBULATORY NON FORMULARY MEDICATION Take 1,000 mg by mouth daily. Medication Name: BCAA 1000 mg   ARIPiprazole (ABILIFY) 2 MG tablet Take 2 mg by mouth daily.   atorvastatin  (LIPITOR) 40 MG tablet TAKE 1 TABLET BY MOUTH EVERY DAY   Creatine POWD 2,500 mg by Does not apply route.   etodolac (LODINE) 400 MG tablet 1 TABLET WITH FOOD TWICE A DAY ORAL 90   lamoTRIgine (LAMICTAL) 200 MG tablet Take 200 mg by mouth daily.   Melatonin 2.5 MG CAPS Take by mouth at bedtime as needed.   Minoxidil (ROGAINE MENS) 5 % FOAM Apply topically.   propranolol (INDERAL) 10 MG tablet Take 10 mg by mouth 2 (two) times daily.   sertraline (ZOLOFT) 50 MG tablet Take 50 mg by mouth daily.   No facility-administered medications prior to visit.    ROS        Objective:     BP 108/69   Pulse 63   Ht 5\' 8"   (1.727 m)   Wt 186 lb 1.9 oz (84.4 kg)   SpO2 98%   BMI 28.30 kg/m     Physical Exam Constitutional:      Appearance: Normal appearance.  HENT:     Head: Normocephalic and atraumatic.     Right Ear: Tympanic membrane, ear canal and external ear normal.     Left Ear: Tympanic membrane, ear canal and external ear normal.     Nose: Nose normal.     Mouth/Throat:     Pharynx: Oropharynx is clear.  Eyes:     Extraocular Movements: Extraocular movements intact.     Conjunctiva/sclera: Conjunctivae normal.     Pupils: Pupils are equal, round, and reactive to light.  Neck:     Thyroid: No thyromegaly.  Cardiovascular:     Rate and Rhythm: Normal rate and regular rhythm.  Pulmonary:     Effort: Pulmonary effort is normal.     Breath sounds: Normal breath sounds.  Abdominal:     General: Bowel sounds are normal.     Palpations: Abdomen is soft.     Tenderness: There is no abdominal tenderness.  Musculoskeletal:  General: No swelling.     Cervical back: Neck supple.  Skin:    General: Skin is warm and dry.  Neurological:     Mental Status: He is oriented to person, place, and time.  Psychiatric:        Mood and Affect: Mood normal.        Behavior: Behavior normal.      No results found for any visits on 04/29/24.      Assessment & Plan:    Routine Health Maintenance and Physical Exam  Immunization History  Administered Date(s) Administered   Fluzone Influenza virus vaccine,trivalent (IIV3), split virus 01/04/2018   Influenza, Seasonal, Injecte, Preservative Fre 10/29/2023   Influenza,inj,Quad PF,6+ Mos 10/04/2022   Influenza,inj,quad, With Preservative 11/03/2013, 10/02/2014, 10/07/2015   Influenza-Unspecified 09/03/2021   Moderna Sars-Covid-2 Vaccination 04/19/2020, 05/19/2020, 01/17/2021   Pfizer(Comirnaty)Fall Seasonal Vaccine 12 years and older 10/12/2022   Td 11/03/2004   Tdap 10/24/2013, 10/30/2023   Zoster Recombinant(Shingrix) 04/07/2021, 06/08/2021     Health Maintenance  Topic Date Due   COVID-19 Vaccine (5 - 2024-25 season) 08/05/2023   INFLUENZA VACCINE  07/04/2024   Colonoscopy  12/30/2025   DTaP/Tdap/Td (4 - Td or Tdap) 10/29/2033   Hepatitis C Screening  Completed   HIV Screening  Completed   Zoster Vaccines- Shingrix  Completed   HPV VACCINES  Aged Out   Meningococcal B Vaccine  Aged Out    Discussed health benefits of physical activity, and encouraged him to engage in regular exercise appropriate for his age and condition.  Problem List Items Addressed This Visit       Endocrine   IFG (impaired fasting glucose)   Relevant Orders   HgB A1c     Other   Elevated PSA   Relevant Orders   PSA   Other Visit Diagnoses       Routine general medical examination at a health care facility    -  Primary   Relevant Orders   HgB A1c   CMP14+EGFR   Lipid Panel With LDL/HDL Ratio   PSA      Keep up a regular exercise program and make sure you are eating a healthy diet Try to eat 4 servings of dairy a day, or if you are lactose intolerant take a calcium  with vitamin D daily.  Your vaccines are up to date.    No follow-ups on file.     Duaine German, MD

## 2024-04-30 ENCOUNTER — Ambulatory Visit: Payer: Self-pay | Admitting: Family Medicine

## 2024-04-30 ENCOUNTER — Encounter: Payer: Self-pay | Admitting: Family Medicine

## 2024-04-30 LAB — LIPID PANEL WITH LDL/HDL RATIO
Cholesterol, Total: 175 mg/dL (ref 100–199)
HDL: 34 mg/dL — ABNORMAL LOW (ref >39)
LDL Chol Calc (NIH): 110 mg/dL — ABNORMAL HIGH (ref 0–99)
LDL/HDL Ratio: 3.2 ratio (ref 0.0–3.6)
Triglycerides: 178 mg/dL — ABNORMAL HIGH (ref 0–149)
VLDL Cholesterol Cal: 31 mg/dL (ref 5–40)

## 2024-04-30 LAB — CMP14+EGFR
ALT: 26 IU/L (ref 0–44)
AST: 21 IU/L (ref 0–40)
Albumin: 4.8 g/dL (ref 3.8–4.9)
Alkaline Phosphatase: 83 IU/L (ref 44–121)
BUN/Creatinine Ratio: 15 (ref 9–20)
BUN: 20 mg/dL (ref 6–24)
Bilirubin Total: 0.6 mg/dL (ref 0.0–1.2)
CO2: 21 mmol/L (ref 20–29)
Calcium: 10.1 mg/dL (ref 8.7–10.2)
Chloride: 103 mmol/L (ref 96–106)
Creatinine, Ser: 1.37 mg/dL — ABNORMAL HIGH (ref 0.76–1.27)
Globulin, Total: 1.9 g/dL (ref 1.5–4.5)
Glucose: 96 mg/dL (ref 70–99)
Potassium: 4.3 mmol/L (ref 3.5–5.2)
Sodium: 140 mmol/L (ref 134–144)
Total Protein: 6.7 g/dL (ref 6.0–8.5)
eGFR: 60 mL/min/{1.73_m2} (ref >59)

## 2024-04-30 LAB — PSA: Prostate Specific Ag, Serum: 1 ng/mL (ref 0.0–4.0)

## 2024-04-30 LAB — HEMOGLOBIN A1C
Est. average glucose Bld gHb Est-mCnc: 111 mg/dL
Hgb A1c MFr Bld: 5.5 % (ref 4.8–5.6)

## 2024-04-30 NOTE — Progress Notes (Signed)
 Hi Beauden, kidney function went up significantly it was 1.0 a year ago and then 1.26 months ago though it had been high before.  But this time the serum creatinine was 1.3.  Instead of waiting 6 months someone to repeat this in about 3 weeks.  Also like to get an ultrasound of your kidneys if you are okay with that please let me know and we will get that scheduled.  Lease make sure you are avoiding any Profen or Aleve products.

## 2024-05-28 ENCOUNTER — Other Ambulatory Visit: Payer: Self-pay | Admitting: Family Medicine

## 2024-06-02 DIAGNOSIS — F410 Panic disorder [episodic paroxysmal anxiety] without agoraphobia: Secondary | ICD-10-CM | POA: Diagnosis not present

## 2024-06-02 DIAGNOSIS — F4481 Dissociative identity disorder: Secondary | ICD-10-CM | POA: Diagnosis not present

## 2024-06-02 DIAGNOSIS — F319 Bipolar disorder, unspecified: Secondary | ICD-10-CM | POA: Diagnosis not present

## 2024-06-03 ENCOUNTER — Telehealth: Payer: Self-pay

## 2024-06-03 DIAGNOSIS — R7989 Other specified abnormal findings of blood chemistry: Secondary | ICD-10-CM

## 2024-06-03 NOTE — Telephone Encounter (Signed)
 Message sent to patient via Mychart.

## 2024-06-03 NOTE — Telephone Encounter (Signed)
 Copied from CRM [id number]. Topic: Clinical - Request for Lab/Test Order >> Jun 03, 2024 11:05 AM [name] wrote: Reason for CRM: Patient calling in to get lab orders per Dr. Alvan for creatine levels... Please send a message through Mychart when Lab orders are ready. Patient wants to know if  he would go down stair for labs or directly to the office.

## 2024-06-04 ENCOUNTER — Other Ambulatory Visit: Payer: Self-pay

## 2024-06-04 DIAGNOSIS — R7989 Other specified abnormal findings of blood chemistry: Secondary | ICD-10-CM | POA: Diagnosis not present

## 2024-06-05 ENCOUNTER — Ambulatory Visit: Payer: Self-pay | Admitting: Family Medicine

## 2024-06-05 LAB — CREATININE, SERUM
Creatinine, Ser: 1.26 mg/dL (ref 0.76–1.27)
eGFR: 66 mL/min/{1.73_m2} (ref >59)

## 2024-06-05 NOTE — Progress Notes (Signed)
 Hi Terrance Webb, kidney function looks better.  It is back down to 1.2 compared to 1.3 a month ago.  So I think you are pretty close back to your baseline.  Will plan to recheck again in 6 months.

## 2024-06-26 ENCOUNTER — Other Ambulatory Visit: Payer: Self-pay | Admitting: Family Medicine

## 2024-08-24 ENCOUNTER — Other Ambulatory Visit: Payer: Self-pay | Admitting: Family Medicine

## 2024-10-27 ENCOUNTER — Ambulatory Visit (INDEPENDENT_AMBULATORY_CARE_PROVIDER_SITE_OTHER): Admitting: Family Medicine

## 2024-10-27 DIAGNOSIS — Z23 Encounter for immunization: Secondary | ICD-10-CM

## 2024-10-28 ENCOUNTER — Ambulatory Visit: Admitting: Family Medicine

## 2024-10-28 ENCOUNTER — Ambulatory Visit

## 2024-11-30 ENCOUNTER — Other Ambulatory Visit: Payer: Self-pay | Admitting: Family Medicine

## 2025-04-30 ENCOUNTER — Encounter: Admitting: Family Medicine
